# Patient Record
Sex: Female | Born: 1985 | Race: Black or African American | Hispanic: No | Marital: Single | Smoking: Current every day smoker
Health system: Southern US, Community
[De-identification: ages and names within clinical notes are randomized; demographics above are authoritative.]

## PROBLEM LIST (undated history)

## (undated) DIAGNOSIS — J40 Bronchitis, not specified as acute or chronic: Secondary | ICD-10-CM

---
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
## 2005-02-27 ENCOUNTER — Emergency Department (HOSPITAL_COMMUNITY): Admission: EM | Admit: 2005-02-27 | Discharge: 2005-02-28 | Payer: Self-pay | Admitting: Emergency Medicine

## 2005-04-15 ENCOUNTER — Emergency Department (HOSPITAL_COMMUNITY): Admission: EM | Admit: 2005-04-15 | Discharge: 2005-04-15 | Payer: Self-pay | Admitting: Emergency Medicine

## 2005-07-05 ENCOUNTER — Emergency Department (HOSPITAL_COMMUNITY): Admission: EM | Admit: 2005-07-05 | Discharge: 2005-07-05 | Payer: Self-pay | Admitting: Emergency Medicine

## 2005-08-13 ENCOUNTER — Emergency Department (HOSPITAL_COMMUNITY): Admission: EM | Admit: 2005-08-13 | Discharge: 2005-08-13 | Payer: Self-pay | Admitting: Emergency Medicine

## 2006-01-29 ENCOUNTER — Emergency Department (HOSPITAL_COMMUNITY): Admission: EM | Admit: 2006-01-29 | Discharge: 2006-01-29 | Payer: Self-pay | Admitting: Family Medicine

## 2006-09-20 ENCOUNTER — Inpatient Hospital Stay (HOSPITAL_COMMUNITY): Admission: AD | Admit: 2006-09-20 | Discharge: 2006-09-20 | Payer: Self-pay | Admitting: Obstetrics & Gynecology

## 2010-08-30 ENCOUNTER — Emergency Department (HOSPITAL_COMMUNITY)
Admission: EM | Admit: 2010-08-30 | Discharge: 2010-08-30 | Disposition: A | Discharge status: Home / Self Care | Payer: Self-pay | Attending: Emergency Medicine | Admitting: Emergency Medicine

## 2010-08-30 DIAGNOSIS — S025XXA Fracture of tooth (traumatic), initial encounter for closed fracture: Secondary | ICD-10-CM | POA: Insufficient documentation

## 2010-08-30 DIAGNOSIS — X58XXXA Exposure to other specified factors, initial encounter: Secondary | ICD-10-CM | POA: Insufficient documentation

## 2010-08-30 DIAGNOSIS — K089 Disorder of teeth and supporting structures, unspecified: Secondary | ICD-10-CM | POA: Insufficient documentation

## 2011-06-19 ENCOUNTER — Encounter (HOSPITAL_COMMUNITY): Payer: Self-pay

## 2011-06-19 ENCOUNTER — Emergency Department (HOSPITAL_COMMUNITY)
Admission: EM | Admit: 2011-06-19 | Discharge: 2011-06-19 | Disposition: A | Discharge status: Home / Self Care | Payer: Self-pay | Attending: Emergency Medicine | Admitting: Emergency Medicine

## 2011-06-19 DIAGNOSIS — L259 Unspecified contact dermatitis, unspecified cause: Secondary | ICD-10-CM

## 2011-06-19 DIAGNOSIS — M7989 Other specified soft tissue disorders: Secondary | ICD-10-CM | POA: Insufficient documentation

## 2011-06-19 MED ORDER — DIPHENHYDRAMINE HCL 25 MG PO CAPS
25.0000 mg | ORAL_CAPSULE | Freq: Once | ORAL | Status: AC
  Administered 2011-06-19: 25 mg via ORAL
  Filled 2011-06-19: qty 1

## 2011-06-19 NOTE — ED Provider Notes (Signed)
History     CSN: [id number]  Arrival date & time 06/19/11  1128   First MD Initiated Contact with Patient 06/19/11 1227      No chief complaint on file.   (Consider location/radiation/quality/duration/timing/severity/associated sxs/prior treatment) HPI Comments: Patient reports that yesterday evening she put a ring on that belonged to her grandmother.  She has never worn this ring before.  This morning when she woke up the finger was swollen and was burning.  She reports that she has never had an allergic reaction to jewelry before.  She is not having any other symptoms.  The history is provided by the patient.    Past Medical History  Diagnosis Date  . Asthma     No past surgical history on file.  No family history on file.  History  Substance Use Topics  . Smoking status: Current Some Day Smoker  . Smokeless tobacco: Not on file  . Alcohol Use: No    OB History    Grav Para Term Preterm Abortions TAB SAB Ect Mult Living                  Review of Systems  Constitutional: Negative for fever and chills.  Respiratory: Negative for shortness of breath.   Cardiovascular: Negative for chest pain.  Gastrointestinal: Negative for nausea and vomiting.  Skin: Negative for color change and rash.    Allergies  Review of patient's allergies indicates no known allergies.  Home Medications   Current Outpatient Rx  Name Route Sig Dispense Refill  . IRON PO Oral Take 1 tablet by mouth daily.    Marland Kitchen NAPROXEN SODIUM 220 MG PO TABS Oral Take 1,760 mg by mouth every 8 (eight) hours as needed. For pain.      BP 135/69  Pulse 89  Temp(Src) 98.3 F (36.8 C) (Oral)  Resp 20  SpO2 98%  Physical Exam  Nursing note and vitals reviewed. Constitutional: She is oriented to person, place, and time. She appears well-developed and well-nourished. No distress.  HENT:  Head: Normocephalic and atraumatic.  Cardiovascular: Normal rate, regular rhythm and normal heart sounds.     Pulmonary/Chest: Effort normal and breath sounds normal.  Musculoskeletal:       Swelling of the 4th digit of the right hand around the ring.  Normal capillary refill.  Sensation intact.    Neurological: She is alert and oriented to person, place, and time.  Skin: Skin is warm and dry. She is not diaphoretic.  Psychiatric: She has a normal mood and affect.    ED Course  Procedures (including critical care time)  Labs Reviewed - No data to display No results found.   1. Contact dermatitis    Ring on left 4th digit removed with a ring cutter while in the ED.   MDM  Feel that symptoms most likely due to an allergic reaction to something that was in the ring.  Ring removed while in the ED.  Patient discharged home and instructed to not wear that ring again.        Pascal Lux [facility] 06/19/11 1631

## 2011-06-19 NOTE — ED Notes (Signed)
Pt put on a new ring last night (ring from grandmother, material unknown), woke up this morning with swollen finger where the ring is. Tender upon touch, good capillary refill. Pain 10/10. Pt reports the ring fits fine when first put on.

## 2011-06-19 NOTE — ED Notes (Signed)
Denied any itchiness, reports the finger is burning. Ice applied.

## 2011-06-20 NOTE — ED Provider Notes (Signed)
Medical screening examination/treatment/procedure(s) were performed by non-physician practitioner and as supervising physician I was immediately available for consultation/collaboration.   Suzi Roots, MD 06/20/11 [phone]

## 2011-11-19 ENCOUNTER — Encounter (HOSPITAL_COMMUNITY): Payer: Self-pay

## 2011-11-19 ENCOUNTER — Emergency Department (HOSPITAL_COMMUNITY)
Admission: EM | Admit: 2011-11-19 | Discharge: 2011-11-19 | Disposition: A | Discharge status: Home / Self Care | Payer: Self-pay | Attending: Emergency Medicine | Admitting: Emergency Medicine

## 2011-11-19 DIAGNOSIS — F17200 Nicotine dependence, unspecified, uncomplicated: Secondary | ICD-10-CM | POA: Insufficient documentation

## 2011-11-19 DIAGNOSIS — K089 Disorder of teeth and supporting structures, unspecified: Secondary | ICD-10-CM | POA: Insufficient documentation

## 2011-11-19 DIAGNOSIS — S0993XA Unspecified injury of face, initial encounter: Secondary | ICD-10-CM

## 2011-11-19 MED ORDER — OXYCODONE-ACETAMINOPHEN 5-325 MG PO TABS
1.0000 | ORAL_TABLET | Freq: Once | ORAL | Status: AC
  Administered 2011-11-19: 1 via ORAL
  Filled 2011-11-19: qty 1

## 2011-11-19 MED ORDER — OXYCODONE-ACETAMINOPHEN 5-325 MG PO TABS: 1.0000 | ORAL_TABLET | Freq: Four times a day (QID) | ORAL | Status: AC | PRN

## 2011-11-19 MED ORDER — PENICILLIN V POTASSIUM 500 MG PO TABS: 500.0000 mg | ORAL_TABLET | Freq: Four times a day (QID) | ORAL | Status: AC

## 2011-11-19 NOTE — ED Provider Notes (Signed)
History     CSN: [id number]  Arrival date & time 11/19/11  1423   First MD Initiated Contact with Patient 11/19/11 1504      Chief Complaint  Patient presents with  . Dental Pain    (Consider location/radiation/quality/duration/timing/severity/associated sxs/prior treatment) HPI Comments: Patient presents to the emergency department with a dental complaint. Symptoms began last week. The patient has tried to alleviate pain with Vicodin, Advil & aleve.  Pain rated at a 10/10, characterized as throbbing in nature and located right bottom mouth. Patient denies fever, night sweats, chills, difficulty swallowing or opening mouth, SOB, nuchal rigidity or decreased ROM of neck.  Patient does not have a dentist and requests a resource guide at discharge.   Patient is a 26 y.o. female presenting with tooth pain. The history is provided by the patient.  Dental PainPrimary symptoms do not include headaches, fever, shortness of breath or sore throat.  Additional symptoms do not include: facial swelling, trouble swallowing, drooling and ear pain.    Past Medical History  Diagnosis Date  . Asthma     No past surgical history on file.  No family history on file.  History  Substance Use Topics  . Smoking status: Current Some Day Smoker -- 0.1 packs/day    Types: Cigarettes  . Smokeless tobacco: Not on file  . Alcohol Use: No    OB History    Grav Para Term Preterm Abortions TAB SAB Ect Mult Living                  Review of Systems  Constitutional: Negative for fever, chills, diaphoresis, activity change and appetite change.  HENT: Positive for dental problem. Negative for ear pain, congestion, sore throat, facial swelling, drooling, mouth sores, trouble swallowing, neck pain, neck stiffness, voice change, sinus pressure and tinnitus.   Eyes: Negative for pain and visual disturbance.  Respiratory: Negative for shortness of breath, wheezing and stridor.   Cardiovascular: Negative for  chest pain and leg swelling.  Gastrointestinal: Negative for nausea and abdominal pain.  Genitourinary: Negative for dysuria, urgency and frequency.  Musculoskeletal: Negative for myalgias.  Skin: Negative for rash.  Neurological: Negative for dizziness, syncope, speech difficulty, weakness, light-headedness, numbness and headaches.  Hematological: Negative for adenopathy.  Psychiatric/Behavioral: Negative for confusion.  All other systems reviewed and are negative.    Allergies  Review of patient's allergies indicates no known allergies.  Home Medications   Current Outpatient Rx  Name Route Sig Dispense Refill  . ACETAMINOPHEN 500 MG PO TABS Oral Take 1,500-2,000 mg by mouth every 6 (six) hours as needed. For pain.    Marland Kitchen VICODIN PO Oral Take 1 tablet by mouth once.    Drema Balzarine HCL 200-25 MG PO CAPS Oral Take 2 tablets by mouth every 8 (eight) hours as needed. For pain.      BP 131/71  Pulse 80  Temp 99.4 F (37.4 C) (Oral)  Resp 24  SpO2 98%  LMP 10/19/2011  Physical Exam  Nursing note and vitals reviewed. Constitutional: She is oriented to person, place, and time. She appears well-developed and well-nourished. No distress.  HENT:  Head: Normocephalic and atraumatic. No trismus in the jaw.  Mouth/Throat: Uvula is midline, oropharynx is clear and moist and mucous membranes are normal. Abnormal dentition. No dental abscesses or uvula swelling. No oropharyngeal exudate, posterior oropharyngeal edema, posterior oropharyngeal erythema or tonsillar abscesses.       Pt able to open and close mouth. Airway intact. Uvula  midline. Mild gingival swelling with tenderness over affected area, but no fluctuance. No swelling or tenderness of submental and submandibular regions. Bottom right middle molar with dental decay adn possible exposed dentin   Eyes: Conjunctivae and EOM are normal.  Neck: Normal range of motion and full passive range of motion without pain. Neck  supple.       Able to actively flex and extend neck, pain free ROM  Cardiovascular: Normal rate and regular rhythm.   Pulmonary/Chest: Effort normal and breath sounds normal. No stridor. No respiratory distress. She has no wheezes.       Airway intact, no drooling stridor or wheezing   Musculoskeletal: Normal range of motion.  Lymphadenopathy:       Head (right side): No submental, no submandibular, no tonsillar, no preauricular and no posterior auricular adenopathy present.       Head (left side): No submental, no submandibular, no tonsillar, no preauricular and no posterior auricular adenopathy present.    She has no cervical adenopathy.  Neurological: She is alert and oriented to person, place, and time.  Skin: Skin is warm and dry. No rash noted. She is not diaphoretic.    ED Course  Procedures (including critical care time)  Labs Reviewed - No data to display No results found.   No diagnosis found.    MDM  Dental pain  Patient with toothache.  No gross abscess.  Exam unconcerning for Ludwig's angina or spread of infection.  Will treat with penicillin and pain medicine.  Urged patient to follow-up with dentist.           Jaci Carrel, PA-C 11/19/11 1535

## 2011-11-19 NOTE — Discharge Instructions (Signed)
You have a dental injury. Use the resource guide listed below to help you find a dentist if you do not already have one to followup with. It is very important that you get evaluated by a dentist as soon as possible. Call tomorrow to schedule an appointment. Use your pain medication as prescribed and do not operate heavy machinery while on pain medication. Note that your pain medication contains acetaminophen (Tylenol) & its is not reccommended that you use additional acetaminophen (Tylenol) while taking this medication. Take your full course of antibiotics. Read the instructions below.  Eat a soft or liquid diet and rinse your mouth out after meals with warm water. You should see a dentist or return here at once if you have increased swelling, increased pain or uncontrolled bleeding from the site of your injury.   SEEK MEDICAL CARE IF:   You have increased pain not controlled with medicines.   You have swelling around your tooth, in your face or neck.   You have bleeding which starts, continues, or gets worse.   You have a fever >101  If you are unable to open your mouth  RESOURCE GUIDE  Dental Problems  Patients with Medicaid: [facility]                     [facility] [address]                                           [address] Phone:  [phone]                                                  Phone:  [phone]  If unable to pay or uninsured, contact:  [facility] or [facility] to become qualified for the adult dental clinic.  Chronic Pain Problems Contact [facility]  [phone] Patients need to be referred by their primary care doctor.  Insufficient Money for Medicine Contact [employer]:  call "211" or [facility] 271-5999.  No Primary Care Doctor Call [facility]  832-8000 Other agencies that provide inexpensive medical care    [facility]  [phone]    [facility]  [phone]    [facility]  [phone]    [facility] Clinic  [phone]    [facility]  [phone]    [facility]  [phone]  Psychological Services [facility]  [phone] [facility]  [phone] [facility]   [phone] (emergency services [phone])  Substance Abuse Resources [facility]  336-882-2125 [facility] 336-784-9470 [facility] 336-285-9073 [facility] 336-845-3988 Residential & Outpatient Substance Abuse Program  800-659-3381  Abuse/Neglect [place] Child Abuse Hotline (336) 641-3795 [place] Child Abuse Hotline 800-378-5315 (After Hours)  Emergency Shelter [facility] (336) 271-5985  [facility] (336) 275-9566 [facility] (704) 372-4663  MRSA Hotline #:   832-7006    [place] Resources  [facility] [place]     [employer]                            [facility] [address] [place]                       [address]      [address]  [place]                                                [place]                            [place] Phone:  [phone]                                   Phone:  [phone]                 Phone:  [phone]  [facility] Phone:  [phone]  [place] Child Abuse Hotline (336) 342-1394 (336) 342-3537 (After Hours)    

## 2011-11-19 NOTE — ED Provider Notes (Signed)
Medical screening examination/treatment/procedure(s) were performed by non-physician practitioner and as supervising physician I was immediately available for consultation/collaboration.   Dannia Snook A Kingjames Coury, MD 11/19/11 2359 

## 2011-11-19 NOTE — ED Notes (Signed)
Bottom RT tooth pain, broke off a few months ago.  Jaw is swollen and painful and states her dentist is not open today.  Also states she doesn't have the money to see her dentist anyway.  Has taken vicoden, advil pm, aleve and tylenol for pain and isn't able to get pain under control.

## 2012-06-19 ENCOUNTER — Emergency Department (HOSPITAL_COMMUNITY)
Admission: EM | Admit: 2012-06-19 | Discharge: 2012-06-19 | Disposition: A | Discharge status: Home / Self Care | Payer: Self-pay | Attending: Emergency Medicine | Admitting: Emergency Medicine

## 2012-06-19 ENCOUNTER — Encounter (HOSPITAL_COMMUNITY): Payer: Self-pay | Admitting: *Deleted

## 2012-06-19 ENCOUNTER — Emergency Department (HOSPITAL_COMMUNITY): Payer: Self-pay

## 2012-06-19 DIAGNOSIS — R52 Pain, unspecified: Secondary | ICD-10-CM | POA: Insufficient documentation

## 2012-06-19 DIAGNOSIS — J111 Influenza due to unidentified influenza virus with other respiratory manifestations: Secondary | ICD-10-CM

## 2012-06-19 DIAGNOSIS — J189 Pneumonia, unspecified organism: Secondary | ICD-10-CM | POA: Insufficient documentation

## 2012-06-19 DIAGNOSIS — J45909 Unspecified asthma, uncomplicated: Secondary | ICD-10-CM | POA: Insufficient documentation

## 2012-06-19 DIAGNOSIS — F17200 Nicotine dependence, unspecified, uncomplicated: Secondary | ICD-10-CM | POA: Insufficient documentation

## 2012-06-19 DIAGNOSIS — Z862 Personal history of diseases of the blood and blood-forming organs and certain disorders involving the immune mechanism: Secondary | ICD-10-CM | POA: Insufficient documentation

## 2012-06-19 LAB — CBC WITH DIFFERENTIAL/PLATELET
Basophils Relative: 0 % (ref 0–1)
HCT: 28.3 % — ABNORMAL LOW (ref 36.0–46.0)
Hemoglobin: 8.5 g/dL — ABNORMAL LOW (ref 12.0–15.0)
Lymphocytes Relative: 40 % (ref 12–46)
Monocytes Relative: 13 % — ABNORMAL HIGH (ref 3–12)
Neutro Abs: 1.7 10*3/uL (ref 1.7–7.7)
WBC: 3.6 10*3/uL — ABNORMAL LOW (ref 4.0–10.5)

## 2012-06-19 LAB — COMPREHENSIVE METABOLIC PANEL
ALT: 17 U/L (ref 0–35)
Alkaline Phosphatase: 92 U/L (ref 39–117)
CO2: 24 mEq/L (ref 19–32)
GFR calc Af Amer: >90 mL/min (ref >90)
Glucose, Bld: 95 mg/dL (ref 70–99)
Potassium: 3.7 mEq/L (ref 3.5–5.1)
Sodium: 135 mEq/L (ref 135–145)
Total Protein: 7.5 g/dL (ref 6.0–8.3)

## 2012-06-19 MED ORDER — ONDANSETRON HCL 4 MG/2ML IJ SOLN
4.0000 mg | Freq: Once | INTRAMUSCULAR | Status: AC
  Administered 2012-06-19: 4 mg via INTRAVENOUS
  Filled 2012-06-19: qty 2

## 2012-06-19 MED ORDER — KETOROLAC TROMETHAMINE 30 MG/ML IJ SOLN
30.0000 mg | Freq: Once | INTRAMUSCULAR | Status: AC
  Administered 2012-06-19: 30 mg via INTRAVENOUS
  Filled 2012-06-19: qty 1

## 2012-06-19 MED ORDER — METOCLOPRAMIDE HCL 10 MG PO TABS: 10.0000 mg | ORAL_TABLET | Freq: Four times a day (QID) | ORAL | Status: DC | PRN

## 2012-06-19 MED ORDER — ALBUTEROL SULFATE (5 MG/ML) 0.5% IN NEBU
5.0000 mg | INHALATION_SOLUTION | Freq: Once | RESPIRATORY_TRACT | Status: AC
  Administered 2012-06-19: 5 mg via RESPIRATORY_TRACT
  Filled 2012-06-19: qty 1

## 2012-06-19 MED ORDER — AMOXICILLIN-POT CLAVULANATE 875-125 MG PO TABS: 1.0000 | ORAL_TABLET | Freq: Two times a day (BID) | ORAL | Status: DC

## 2012-06-19 MED ORDER — LEVOFLOXACIN IN D5W 750 MG/150ML IV SOLN
750.0000 mg | Freq: Once | INTRAVENOUS | Status: AC
  Administered 2012-06-19: 750 mg via INTRAVENOUS
  Filled 2012-06-19: qty 150

## 2012-06-19 MED ORDER — ALBUTEROL SULFATE (5 MG/ML) 0.5% IN NEBU
INHALATION_SOLUTION | RESPIRATORY_TRACT | Status: AC
  Filled 2012-06-19: qty 1

## 2012-06-19 MED ORDER — ALBUTEROL SULFATE HFA 108 (90 BASE) MCG/ACT IN AERS: 1.0000 | INHALATION_SPRAY | Freq: Four times a day (QID) | RESPIRATORY_TRACT | Status: DC | PRN

## 2012-06-19 MED ORDER — SODIUM CHLORIDE 0.9 % IV BOLUS (SEPSIS)
1000.0000 mL | Freq: Once | INTRAVENOUS | Status: AC
  Administered 2012-06-19: 1000 mL via INTRAVENOUS

## 2012-06-19 NOTE — ED Notes (Signed)
Pt c/o nausea, vomiting, generalized body aches, and sore throat since last Thursday. Pt reports vomiting x's 5 in past 24 hours.

## 2012-06-19 NOTE — ED Provider Notes (Signed)
History     CSN: [id number]  Arrival date & time 06/19/12  1520   First MD Initiated Contact with Patient 06/19/12 1836      Chief Complaint  Patient presents with  . Emesis  . Generalized Body Aches  . Nausea    (Consider location/radiation/quality/duration/timing/severity/associated sxs/prior treatment) The history is provided by the patient.  Cheryl Crane is a 27 y.o. female history of asthma here with body aches, nausea, and SOB. Diffuse bodyaches for the last 5 days. She also has some subjective fevers at home and some sore throat. Today she felt nauseous and vomited several times. While in the bathroom today she feels short of breath but denies any wheezing. She didn't have a flu shot this year and she denies any sick contacts.   Past Medical History  Diagnosis Date  . Asthma     History reviewed. No pertinent past surgical history.  History reviewed. No pertinent family history.  History  Substance Use Topics  . Smoking status: Current Some Day Smoker -- 0.1 packs/day    Types: Cigarettes  . Smokeless tobacco: Not on file  . Alcohol Use: No    OB History    Grav Para Term Preterm Abortions TAB SAB Ect Mult Living                  Review of Systems  Constitutional: Positive for fever.  Respiratory: Positive for shortness of breath.   Gastrointestinal: Positive for vomiting.  All other systems reviewed and are negative.    Allergies  Review of patient's allergies indicates no known allergies.  Home Medications   Current Outpatient Rx  Name  Route  Sig  Dispense  Refill  . IBUPROFEN 200 MG PO TABS   Oral   Take 1,800 mg by mouth once.           BP 139/56  Pulse 94  Temp 99.1 F (37.3 C) (Oral)  Resp 20  Ht 5\' 1"  (1.549 m)  Wt 209 lb (94.802 kg)  BMI 39.49 kg/m2  SpO2 95%  LMP 06/19/2012  Physical Exam  Nursing note and vitals reviewed. Constitutional: She is oriented to person, place, and time. She appears well-developed.   Tired   HENT:  Head: Normocephalic.       MM slightly dry   Eyes: Conjunctivae normal are normal. Pupils are equal, round, and reactive to light.  Neck: Normal range of motion. Neck supple.  Cardiovascular: Normal rate, regular rhythm and normal heart sounds.   Pulmonary/Chest: Effort normal and breath sounds normal.       Diminished breath sounds throughout, no wheezing or crackles.   Abdominal: Soft. Bowel sounds are normal. She exhibits no distension. There is no tenderness. There is no rebound.  Musculoskeletal: Normal range of motion. She exhibits no edema.  Neurological: She is alert and oriented to person, place, and time.  Skin: Skin is warm and dry.  Psychiatric: She has a normal mood and affect. Her behavior is normal. Judgment and thought content normal.    ED Course  Procedures (including critical care time)  Labs Reviewed  CBC WITH DIFFERENTIAL - Abnormal; Notable for the following:    WBC 3.6 (*)     Hemoglobin 8.5 (*)     HCT 28.3 (*)     MCV 58.6 (*)     MCH 17.6 (*)     RDW 19.2 (*)     Monocytes Relative 13 (*)     All other components  within normal limits  COMPREHENSIVE METABOLIC PANEL   Dg Chest 2 View  06/19/2012  *RADIOLOGY REPORT*  Clinical Data: Chest pain, cough, emesis, body aches  CHEST - 2 VIEW  Comparison: 01/29/2006  Findings: Normal heart size, mediastinal contours, and pulmonary vascularity. Minimal patchy density in the left upper lobe question infiltrate. Minimal peribronchial thickening. Remaining lungs clear. No pleural effusion or pneumothorax. Bones unremarkable.  IMPRESSION: Minimal peribronchial thickening which could reflect bronchitis or reactive airway disease. Minimal patchy density left upper lobe suspicious for pneumonia. As the finding in left upper lobe is somewhat nodular in appearance, recommend follow-up chest radiograph 4 weeks to ensure resolution, in order to exclude pulmonary nodule.   Original Report Authenticated By: Ulyses Southward,  M.D.      No diagnosis found.    MDM  Cheryl Crane is a 28 y.o. female here with body aches, fevers. CXR showed L upper pneumonia. But I think she likely has flu. Will get labs, give IVF, levaquin and reassess. Patient doesn't appear septic and is not hypotensive.   9:09 PM Hg 8.5, but patient is currently on her period and she has that she has hx of anemia. Felt better after fluids and IV levaquin. Will d/c home on augmentin (patient doesn't have insurance). Return precaution given.       Richardean Canal, MD 06/19/12 2110

## 2012-06-19 NOTE — ED Notes (Signed)
Discharge instructions reviewed w/ pt., verbalizes understanding. Three prescriptions provided at discharge. 

## 2012-07-31 ENCOUNTER — Encounter (HOSPITAL_COMMUNITY): Payer: Self-pay

## 2012-07-31 ENCOUNTER — Inpatient Hospital Stay (HOSPITAL_COMMUNITY)
Admission: AD | Admit: 2012-07-31 | Discharge: 2012-07-31 | Disposition: A | Discharge status: Home / Self Care | Payer: Self-pay | Source: Ambulatory Visit | Attending: Obstetrics & Gynecology | Admitting: Obstetrics & Gynecology

## 2012-07-31 ENCOUNTER — Inpatient Hospital Stay (HOSPITAL_COMMUNITY): Payer: Self-pay

## 2012-07-31 DIAGNOSIS — R1032 Left lower quadrant pain: Secondary | ICD-10-CM | POA: Insufficient documentation

## 2012-07-31 DIAGNOSIS — R109 Unspecified abdominal pain: Secondary | ICD-10-CM

## 2012-07-31 DIAGNOSIS — O9989 Other specified diseases and conditions complicating pregnancy, childbirth and the puerperium: Secondary | ICD-10-CM

## 2012-07-31 DIAGNOSIS — D259 Leiomyoma of uterus, unspecified: Secondary | ICD-10-CM

## 2012-07-31 DIAGNOSIS — O99891 Other specified diseases and conditions complicating pregnancy: Secondary | ICD-10-CM | POA: Insufficient documentation

## 2012-07-31 DIAGNOSIS — O26899 Other specified pregnancy related conditions, unspecified trimester: Secondary | ICD-10-CM

## 2012-07-31 HISTORY — DX: Leiomyoma of uterus, unspecified: D25.9

## 2012-07-31 HISTORY — DX: Bronchitis, not specified as acute or chronic: J40

## 2012-07-31 LAB — URINALYSIS, ROUTINE W REFLEX MICROSCOPIC
Bilirubin Urine: NEGATIVE
Hgb urine dipstick: NEGATIVE
Ketones, ur: 15 mg/dL — AB
Nitrite: NEGATIVE
pH: 6 (ref 5.0–8.0)

## 2012-07-31 LAB — WET PREP, GENITAL
Clue Cells Wet Prep HPF POC: NONE SEEN
Trich, Wet Prep: NONE SEEN
WBC, Wet Prep HPF POC: NONE SEEN
Yeast Wet Prep HPF POC: NONE SEEN

## 2012-07-31 NOTE — MAU Note (Signed)
Patient states she had some abdominal pain on 2-22 that stopped. Started again this am at 0400.Denies bleeding, discharge, nausea, vomiting, diarrhea or pain with urination.

## 2012-07-31 NOTE — MAU Provider Note (Signed)
History     CSN: [id number]  Arrival date and time: 07/31/12 4098   First Provider Initiated Contact with Patient 07/31/12 1021      Chief Complaint  Patient presents with  . Abdominal Pain   HPI This is a 27 y.o. female at [redacted]w[redacted]d who presents with c/o intermittent Left lower quadrant pain. States it happened Saturday and again today. Had no idea she was pregnant. Was getting ready to move to [place] and does not want to be pregnant. Denies other symptoms, except a little constipation  RN Note: Patient states she had some abdominal pain on 2-22 that stopped. Started again this am at 0400.Denies bleeding, discharge, nausea, vomiting, diarrhea or pain with urination.   OB History   Grav Para Term Preterm Abortions TAB SAB Ect Mult Living   1               Past Medical History  Diagnosis Date  . Asthma   . Bronchitis     History reviewed. No pertinent past surgical history.  Family History  Problem Relation Age of Onset  . Diabetes Mother   . Heart disease Father     History  Substance Use Topics  . Smoking status: Current Some Day Smoker -- 0.15 packs/day    Types: Cigarettes  . Smokeless tobacco: Not on file  . Alcohol Use: No    Allergies: No Known Allergies  Prescriptions prior to admission  Medication Sig Dispense Refill  . albuterol (PROVENTIL HFA;VENTOLIN HFA) 108 (90 BASE) MCG/ACT inhaler Inhale 1-2 puffs into the lungs every 6 (six) hours as needed for wheezing.  1 Inhaler  0  . amoxicillin-clavulanate (AUGMENTIN) 875-125 MG per tablet Take 1 tablet by mouth 2 (two) times daily. One po bid x 7 days  14 tablet  0  . ibuprofen (ADVIL,MOTRIN) 200 MG tablet Take 1,800 mg by mouth once.      . metoCLOPramide (REGLAN) 10 MG tablet Take 1 tablet (10 mg total) by mouth every 6 (six) hours as needed (nausea/headache).  10 tablet  0    Review of Systems  Constitutional: Negative for fever, chills and malaise/fatigue.  Gastrointestinal: Positive for abdominal pain  and constipation. Negative for nausea, vomiting and diarrhea.  Genitourinary: Negative for dysuria.   Physical Exam   Blood pressure 137/82, pulse 78, temperature 98.4 F (36.9 C), temperature source Oral, resp. rate 16, height 5' 2.5" (1.588 m), weight 217 lb 9.6 oz (98.703 kg), last menstrual period 06/17/2012, SpO2 100.00%.  Physical Exam  Constitutional: She appears well-developed and well-nourished. No distress.  HENT:  Head: Normocephalic.  Cardiovascular: Normal rate.   Respiratory: Effort normal.  GI: Soft.   Results for orders placed during the hospital encounter of 07/31/12 (from the past 24 hour(s))  POCT PREGNANCY, URINE     Status: Abnormal   Collection Time    07/31/12 10:25 AM      Result Value Range   Preg Test, Ur POSITIVE (*) NEGATIVE    MAU Course  Procedures  MDM Will check quant and [place].  Assessment and Plan    [facility] 07/31/2012, 10:43 AM  Care assumed at 1300:  Results for orders placed during the hospital encounter of 07/31/12 (from the past 24 hour(s))  URINALYSIS, ROUTINE W REFLEX MICROSCOPIC     Status: Abnormal   Collection Time    07/31/12 10:18 AM      Result Value Range   Color, Urine YELLOW  YELLOW   APPearance CLEAR  CLEAR  Specific Gravity, Urine 1.025  1.005 - 1.030   pH 6.0  5.0 - 8.0   Glucose, UA NEGATIVE  NEGATIVE mg/dL   Hgb urine dipstick NEGATIVE  NEGATIVE   Bilirubin Urine NEGATIVE  NEGATIVE   Ketones, ur 15 (*) NEGATIVE mg/dL   Protein, ur NEGATIVE  NEGATIVE mg/dL   Urobilinogen, UA 1.0  0.0 - 1.0 mg/dL   Nitrite NEGATIVE  NEGATIVE   Leukocytes, UA NEGATIVE  NEGATIVE  POCT PREGNANCY, URINE     Status: Abnormal   Collection Time    07/31/12 10:25 AM      Result Value Range   Preg Test, Ur POSITIVE (*) NEGATIVE  HCG, QUANTITATIVE, PREGNANCY     Status: Abnormal   Collection Time    07/31/12 10:49 AM      Result Value Range   hCG, Beta Chain, Quant, S 821 (*) <5 mIU/mL  WET PREP, GENITAL     Status: None    Collection Time    07/31/12 10:55 AM      Result Value Range   Yeast Wet Prep HPF POC NONE SEEN  NONE SEEN   Trich, Wet Prep NONE SEEN  NONE SEEN   Clue Cells Wet Prep HPF POC NONE SEEN  NONE SEEN   WBC, Wet Prep HPF POC NONE SEEN  NONE SEEN  No results found.

## 2012-08-01 ENCOUNTER — Encounter: Payer: Self-pay | Admitting: Advanced Practice Midwife

## 2012-08-01 LAB — GC/CHLAMYDIA PROBE AMP: GC Probe RNA: NEGATIVE

## 2012-08-02 ENCOUNTER — Inpatient Hospital Stay (HOSPITAL_COMMUNITY)
Admission: AD | Admit: 2012-08-02 | Discharge: 2012-08-02 | Disposition: A | Discharge status: Home / Self Care | Payer: Self-pay | Source: Ambulatory Visit | Attending: Obstetrics & Gynecology | Admitting: Obstetrics & Gynecology

## 2012-08-02 DIAGNOSIS — Z3201 Encounter for pregnancy test, result positive: Secondary | ICD-10-CM

## 2012-08-02 DIAGNOSIS — R109 Unspecified abdominal pain: Secondary | ICD-10-CM | POA: Insufficient documentation

## 2012-08-02 DIAGNOSIS — O99891 Other specified diseases and conditions complicating pregnancy: Secondary | ICD-10-CM | POA: Insufficient documentation

## 2012-08-02 NOTE — MAU Provider Note (Signed)
Ms. Cheryl Crane is a 27 y.o. G1P0 at [redacted]w[redacted]d who presents to MAU for follow-up 48 hour hCG. The patient denies pain or vaginal bleeding today. Quant hCG at last visit was 821. [place] did not show anything in the uterus or adnexa.   Results for orders placed during the hospital encounter of 08/02/12 (from the past 24 hour(s))  HCG, QUANTITATIVE, PREGNANCY     Status: Abnormal   Collection Time    08/02/12 10:34 AM      Result Value Range   hCG, Beta Chain, Quant, S 1118 (*) <5 mIU/mL   MDM: Discussed lab results with Dr. Macon Large. She recommends follow-up quant hCG in another 48 hours, then possible [place] then if still abnormal rise.   A: Abnormal rise in quant hCG  P: Discharge patient Return in 48 hours for follow-up quant hCG Bleeding/Ectopic precautions discussed Patient may return to MAU sooner if symptoms change or worsen  Freddi Starr, PA-C  08/02/2012 11:35 AM

## 2012-08-02 NOTE — MAU Note (Signed)
Patient to MAU for repeat BHCG. Patient denies any pain or bleeding.  

## 2012-08-03 NOTE — MAU Provider Note (Signed)
Attestation of Attending Supervision of Advanced Practitioner (PA/CNM/NP): Evaluation and management procedures were performed by the Advanced Practitioner under my supervision and collaboration.  I have reviewed the Advanced Practitioner's note and chart, and I agree with the management and plan.  Mandeep Ferch, MD, FACOG Attending Obstetrician & Gynecologist Faculty Practice, [facility]  

## 2012-08-04 ENCOUNTER — Inpatient Hospital Stay (HOSPITAL_COMMUNITY): Payer: Self-pay

## 2012-08-04 ENCOUNTER — Encounter (HOSPITAL_COMMUNITY): Payer: Self-pay | Admitting: Advanced Practice Midwife

## 2012-08-04 ENCOUNTER — Inpatient Hospital Stay (HOSPITAL_COMMUNITY)
Admission: AD | Admit: 2012-08-04 | Discharge: 2012-08-04 | Disposition: A | Discharge status: Home / Self Care | Payer: Self-pay | Source: Ambulatory Visit | Attending: Obstetrics and Gynecology | Admitting: Obstetrics and Gynecology

## 2012-08-04 DIAGNOSIS — R1032 Left lower quadrant pain: Secondary | ICD-10-CM | POA: Insufficient documentation

## 2012-08-04 DIAGNOSIS — Z09 Encounter for follow-up examination after completed treatment for conditions other than malignant neoplasm: Secondary | ICD-10-CM | POA: Insufficient documentation

## 2012-08-04 DIAGNOSIS — O99891 Other specified diseases and conditions complicating pregnancy: Secondary | ICD-10-CM | POA: Insufficient documentation

## 2012-08-04 NOTE — MAU Provider Note (Signed)
Cheryl Crane is a G1P0 at [redacted] weeks EGA here for repeat quant HCG. Seen originally on 2/24 with LLQ pain, quant 821 at that time with no IUP or adnexal mass on u/s. Repeat quant on 2/26 did not rise as expected, 1116 that day. Here for second repeat quant 48 hours later. Denies pain or bleeding today.   BP 120/61  Pulse 88  Temp(Src) 98.1 F (36.7 C) (Oral)  Resp 18  LMP 06/17/2012  Gen: well, no distress  Results for orders placed during the hospital encounter of 08/04/12 (from the past 72 hour(s))  HCG, QUANTITATIVE, PREGNANCY     Status: Abnormal   Collection Time    08/04/12  2:45 PM      Result Value Range   hCG, Beta Chain, Quant, S 1613 (*) <5 mIU/mL   Comment:              GEST. AGE      CONC.  (mIU/mL)       <=1 WEEK        5 - 50         2 WEEKS       50 - 500         3 WEEKS       100 - 10,000         4 WEEKS     1,000 - 30,000         5 WEEKS     3,500 - 115,000       6-8 WEEKS     12,000 - 270,000        12 WEEKS     15,000 - 220,000                FEMALE AND NON-PREGNANT FEMALE:         LESS THAN 5 mIU/mL   US Ob Comp Less 14 Wks  08/04/2012  *RADIOLOGY REPORT*  Clinical Data: abnormally rising quant,abnormal quants; ;  OBSTETRIC <14 WK [place] AND TRANSVAGINAL OB US  Technique: Both transabdominal and transvaginal ultrasound examinations were performed for complete evaluation of the gestation as well as the maternal uterus, adnexal regions, and pelvic cul-de-sac.  Comparison: 07/31/2012  Findings: There is a single early intrauterine pregnancy.  Crown- rump length is 3.8 mm for an estimated gestational age of [redacted] weeks 1 day.  Fetal heart rate 102 beats per minute.  There is a large subchorionic hemorrhage.  Multiple large fibroids.  8.2 cm right fibroid.  6.7 cm right posterior fibroid.  7.0 cm fundal fibroid.  Right ovary is unremarkable.  Left ovary not visualized.  No adnexal masses.  No free fluid.  IMPRESSION: 6-week-1-day intrauterine pregnancy with a fetal heart rate of  102 beats per minute.  There is a large subchorionic hemorrhage. Multiple large uterine fibroids.   Original Report Authenticated By: Charlett Nose, M.D.    US Ob Comp Less 14 Wks  08/01/2012  OBSTETRICAL ULTRASOUND: This exam was performed within a [facility]. The OB US report was generated in the AS system, and faxed to the ordering physician.   This report is also available in [employer] and in the [employer]. See AS Obstetric US report.   US Ob Transvaginal  08/04/2012  *RADIOLOGY REPORT*  Clinical Data: abnormally rising quant,abnormal quants; ;  OBSTETRIC <14 WK [place] AND TRANSVAGINAL OB US  Technique: Both transabdominal and transvaginal ultrasound examinations were performed for complete evaluation of the gestation as well  as the maternal uterus, adnexal regions, and pelvic cul-de-sac.  Comparison: 07/31/2012  Findings: There is a single early intrauterine pregnancy.  Crown- rump length is 3.8 mm for an estimated gestational age of [redacted] weeks 1 day.  Fetal heart rate 102 beats per minute.  There is a large subchorionic hemorrhage.  Multiple large fibroids.  8.2 cm right fibroid.  6.7 cm right posterior fibroid.  7.0 cm fundal fibroid.  Right ovary is unremarkable.  Left ovary not visualized.  No adnexal masses.  No free fluid.  IMPRESSION: 6-week-1-day intrauterine pregnancy with a fetal heart rate of 102 beats per minute.  There is a large subchorionic hemorrhage. Multiple large uterine fibroids.   Original Report Authenticated By: Charlett Nose, M.D.     A/P: 27 y.o. G1P0 at 6 weeks by u/s today - abnormally rising quant, but u/s shows an early IUP with + cardiac activity today. Rev'd with patient that the prognosis is guarded considering quants, large subchorionic hemorrhage. Large fibroids also noted on u/s today.  Start prenatal care as soon as possible, return with heavy bleeding or pain

## 2012-08-04 NOTE — MAU Note (Signed)
Here for rpt BHCG.  Discussed changes in BHCG, is now double what first was, in 4 days. Not what we expect. So the CNM would like to do an [place] to check things out.

## 2012-08-09 ENCOUNTER — Inpatient Hospital Stay (HOSPITAL_COMMUNITY)
Admission: AD | Admit: 2012-08-09 | Discharge: 2012-08-09 | Disposition: A | Discharge status: Home / Self Care | Payer: Medicaid Other | Source: Ambulatory Visit | Attending: Obstetrics & Gynecology | Admitting: Obstetrics & Gynecology

## 2012-08-09 ENCOUNTER — Inpatient Hospital Stay (HOSPITAL_COMMUNITY): Payer: Medicaid Other

## 2012-08-09 ENCOUNTER — Encounter (HOSPITAL_COMMUNITY): Payer: Self-pay | Admitting: *Deleted

## 2012-08-09 DIAGNOSIS — O26851 Spotting complicating pregnancy, first trimester: Secondary | ICD-10-CM

## 2012-08-09 DIAGNOSIS — O26859 Spotting complicating pregnancy, unspecified trimester: Secondary | ICD-10-CM | POA: Insufficient documentation

## 2012-08-09 LAB — CBC
HCT: 28.7 % — ABNORMAL LOW (ref 36.0–46.0)
Hemoglobin: 8.4 g/dL — ABNORMAL LOW (ref 12.0–15.0)
MCHC: 29.3 g/dL — ABNORMAL LOW (ref 30.0–36.0)
RBC: 4.85 MIL/uL (ref 3.87–5.11)

## 2012-08-09 LAB — ABO/RH: ABO/RH(D): A POS

## 2012-08-09 NOTE — MAU Provider Note (Signed)
Attestation of Attending Supervision of Advanced Practitioner (CNM/NP): Evaluation and management procedures were performed by the Advanced Practitioner under my supervision and collaboration.  I have reviewed the Advanced Practitioner's note and chart, and I agree with the management and plan.  Jashiya Bassett 08/09/2012 9:10 AM   

## 2012-08-09 NOTE — MAU Provider Note (Signed)
Chief Complaint: Vaginal Bleeding   First Halvor Behrend Initiated Contact with Patient 08/09/12 [phone]     SUBJECTIVE HPI: Cheryl Crane is a 27 y.o. G1P0 at [redacted]w[redacted]d by LMP who presents with small amount of VB that had continued since previous visit to MAU. [place] 08/04/12 show pos FP, FHR 102, large SCH. Denies abd pain, passage of tissue.    Past Medical History  Diagnosis Date  . Asthma   . Bronchitis    OB History   Grav Para Term Preterm Abortions TAB SAB Ect Mult Living   1              # Outc Date GA Lbr Len/2nd Wgt Sex Del Anes PTL Lv   1 CUR              History reviewed. No pertinent past surgical history. History   Social History  . Marital Status: Single    Spouse Name: N/A    Number of Children: N/A  . Years of Education: N/A   Occupational History  . Not on file.   Social History Main Topics  . Smoking status: Current Some Day Smoker -- 0.15 packs/day    Types: Cigarettes  . Smokeless tobacco: Not on file  . Alcohol Use: No  . Drug Use: No  . Sexually Active: Yes   Other Topics Concern  . Not on file   Social History Narrative  . No narrative on file   No current facility-administered medications on file prior to encounter.   Current Outpatient Prescriptions on File Prior to Encounter  Medication Sig Dispense Refill  . acetaminophen (TYLENOL) 500 MG tablet Take 1,000 mg by mouth daily as needed for pain.      Marland Kitchen albuterol (PROVENTIL HFA;VENTOLIN HFA) 108 (90 BASE) MCG/ACT inhaler Inhale 1-2 puffs into the lungs every 6 (six) hours as needed for wheezing.  1 Inhaler  0   No Known Allergies  ROS: Pertinent items in HPI  OBJECTIVE Blood pressure 136/74, pulse 87, temperature 98.2 F (36.8 C), temperature source Oral, resp. rate 20, height 5' (1.524 m), weight 97.977 kg (216 lb), last menstrual period 06/17/2012, SpO2 99.00%. GENERAL: Well-developed, well-nourished female in no acute distress.  HEENT: Normocephalic HEART: normal rate RESP: normal  effort ABDOMEN: Soft, non-tender NEURO: Alert and oriented SPECULUM EXAM: Deferred. Scant blood on pad.   LAB RESULTS Results for orders placed during the hospital encounter of 08/09/12 (from the past 24 hour(s))  ABO/RH     Status: None   Collection Time    08/09/12  6:31 AM      Result Value Range   ABO/RH(D) A POS    CBC     Status: Abnormal   Collection Time    08/09/12  6:32 AM      Result Value Range   WBC 5.8  4.0 - 10.5 K/uL   RBC 4.85  3.87 - 5.11 MIL/uL   Hemoglobin 8.4 (*) 12.0 - 15.0 g/dL   HCT 78.2 (*) 95.6 - 21.3 %   MCV 59.2 (*) 78.0 - 100.0 fL   MCH 17.3 (*) 26.0 - 34.0 pg   MCHC 29.3 (*) 30.0 - 36.0 g/dL   RDW 08.6 (*) 57.8 - 46.9 %   Platelets 470 (*) 150 - 400 K/uL    IMAGING US Ob Comp Less 14 Wks  08/04/2012  *RADIOLOGY REPORT*  Clinical Data: abnormally rising quant,abnormal quants; ;  OBSTETRIC <14 WK [place] AND TRANSVAGINAL OB US  Technique: Both transabdominal  and transvaginal ultrasound examinations were performed for complete evaluation of the gestation as well as the maternal uterus, adnexal regions, and pelvic cul-de-sac.  Comparison: 07/31/2012  Findings: There is a single early intrauterine pregnancy.  Crown- rump length is 3.8 mm for an estimated gestational age of [redacted] weeks 1 day.  Fetal heart rate 102 beats per minute.  There is a large subchorionic hemorrhage.  Multiple large fibroids.  8.2 cm right fibroid.  6.7 cm right posterior fibroid.  7.0 cm fundal fibroid.  Right ovary is unremarkable.  Left ovary not visualized.  No adnexal masses.  No free fluid.  IMPRESSION: 6-week-1-day intrauterine pregnancy with a fetal heart rate of 102 beats per minute.  There is a large subchorionic hemorrhage. Multiple large uterine fibroids.   Original Report Authenticated By: Charlett Nose, M.D.    US Ob Comp Less 14 Wks  08/01/2012  OBSTETRICAL ULTRASOUND: This exam was performed within a [facility]. The OB US report was generated in the AS system,  and faxed to the ordering physician.   This report is also available in [employer] and in the [employer]. See AS Obstetric US report.   US Ob Transvaginal  08/04/2012  *RADIOLOGY REPORT*  Clinical Data: abnormally rising quant,abnormal quants; ;  OBSTETRIC <14 WK [place] AND TRANSVAGINAL OB US  Technique: Both transabdominal and transvaginal ultrasound examinations were performed for complete evaluation of the gestation as well as the maternal uterus, adnexal regions, and pelvic cul-de-sac.  Comparison: 07/31/2012  Findings: There is a single early intrauterine pregnancy.  Crown- rump length is 3.8 mm for an estimated gestational age of [redacted] weeks 1 day.  Fetal heart rate 102 beats per minute.  There is a large subchorionic hemorrhage.  Multiple large fibroids.  8.2 cm right fibroid.  6.7 cm right posterior fibroid.  7.0 cm fundal fibroid.  Right ovary is unremarkable.  Left ovary not visualized.  No adnexal masses.  No free fluid.  IMPRESSION: 6-week-1-day intrauterine pregnancy with a fetal heart rate of 102 beats per minute.  There is a large subchorionic hemorrhage. Multiple large uterine fibroids.   Original Report Authenticated By: Charlett Nose, M.D.     MAU COURSE  ASSESSMENT 1. Spotting complicating pregnancy in first trimester     PLAN Discharge home Reassurance given. Pelvic rest     Follow-up Information   Follow up with [facility].      Follow up with [facility] [place] [facility]. (As needed if symptoms worsen)    Contact information:   [address] [place] [address] [phone]       Medication List    TAKE these medications       acetaminophen 500 MG tablet  Commonly known as:  TYLENOL  Take 1,000 mg by mouth daily as needed for pain.     albuterol 108 (90 BASE) MCG/ACT inhaler  Commonly known as:  PROVENTIL HFA;VENTOLIN HFA  Inhale 1-2 puffs into the lungs every 6 (six) hours as needed for wheezing.      prenatal multivitamin Tabs  Take 1 tablet by mouth daily at 12 noon.       [place], CNM 08/09/2012  8:20 AM

## 2012-08-09 NOTE — MAU Note (Signed)
Pt presents with complaint of continued bleeding since last visit here. States it not a lot but she is worried.

## 2012-08-16 ENCOUNTER — Encounter (HOSPITAL_COMMUNITY): Payer: Self-pay

## 2012-08-16 ENCOUNTER — Inpatient Hospital Stay (HOSPITAL_COMMUNITY)
Admission: AD | Admit: 2012-08-16 | Discharge: 2012-08-16 | Disposition: A | Discharge status: Home / Self Care | Payer: Medicaid Other | Source: Ambulatory Visit | Attending: Obstetrics and Gynecology | Admitting: Obstetrics and Gynecology

## 2012-08-16 ENCOUNTER — Inpatient Hospital Stay (HOSPITAL_COMMUNITY): Payer: Medicaid Other

## 2012-08-16 DIAGNOSIS — O200 Threatened abortion: Secondary | ICD-10-CM

## 2012-08-16 DIAGNOSIS — R109 Unspecified abdominal pain: Secondary | ICD-10-CM | POA: Insufficient documentation

## 2012-08-16 DIAGNOSIS — O368390 Maternal care for abnormalities of the fetal heart rate or rhythm, unspecified trimester, not applicable or unspecified: Secondary | ICD-10-CM | POA: Insufficient documentation

## 2012-08-16 LAB — URINALYSIS, ROUTINE W REFLEX MICROSCOPIC
Glucose, UA: NEGATIVE mg/dL
Protein, ur: NEGATIVE mg/dL
pH: 6 (ref 5.0–8.0)

## 2012-08-16 LAB — URINE MICROSCOPIC-ADD ON

## 2012-08-16 NOTE — MAU Note (Signed)
Patient states she has had bleeding since last Wednesday but heavier than it has been. States she started having abdominal cramping last night and continues this am.

## 2012-08-16 NOTE — MAU Note (Signed)
Patient is in with c/o increased vaginal bleeding and lower abdominal cramping since yesterday.

## 2012-08-16 NOTE — MAU Provider Note (Signed)
  History     CSN: [id number]  Arrival date and time: 08/16/12 4098   First Provider Initiated Contact with Patient 08/16/12 1011      Chief Complaint  Patient presents with  . Abdominal Pain  . Vaginal Bleeding   HPI Cheryl Crane 27 y.o. [redacted]w[redacted]d   Comes in today with heavier vaginal bleeding and lower abdominal cramping.  Was seen last in MAU on 08-09-12 and had a large subchorionic hemorrhage.  Has had continuous bleeding since previous visit and lower abd cramping since yesterday.  Has passed clots with the bleeding. Is worried about this pregnancy.    OB History   Grav Para Term Preterm Abortions TAB SAB Ect Mult Living   1               Past Medical History  Diagnosis Date  . Asthma   . Bronchitis     History reviewed. No pertinent past surgical history.  Family History  Problem Relation Age of Onset  . Diabetes Mother   . Heart disease Father     History  Substance Use Topics  . Smoking status: Current Some Day Smoker -- 0.15 packs/day    Types: Cigarettes  . Smokeless tobacco: Not on file  . Alcohol Use: No    Allergies: No Known Allergies  Prescriptions prior to admission  Medication Sig Dispense Refill  . acetaminophen (TYLENOL) 500 MG tablet Take 1,000 mg by mouth daily as needed for pain.      . Prenatal Vit-Fe Fumarate-FA (PRENATAL MULTIVITAMIN) TABS Take 1 tablet by mouth daily at 12 noon.      Marland Kitchen albuterol (PROVENTIL HFA;VENTOLIN HFA) 108 (90 BASE) MCG/ACT inhaler Inhale 1-2 puffs into the lungs every 6 (six) hours as needed for wheezing.  1 Inhaler  0    Review of Systems  Constitutional: Negative for fever and chills.  Respiratory: Negative for shortness of breath.   Cardiovascular: Negative for chest pain.  Gastrointestinal: Positive for abdominal pain and constipation. Negative for diarrhea.  Genitourinary: Positive for dysuria.       Vaginal bleeding  Musculoskeletal: Positive for back pain.  Neurological: Negative for dizziness and  headaches.   Physical Exam   Height 5\' 3"  (1.6 m), weight 96.888 kg (213 lb 9.6 oz), last menstrual period 06/17/2012.  Physical Exam  Nursing note and vitals reviewed. Constitutional: She is oriented to person, place, and time. She appears well-developed and well-nourished. No distress.  HENT:  Head: Normocephalic.  Eyes: EOM are normal.  Neck: Neck supple.  Cardiovascular: Normal rate and regular rhythm.   Respiratory: Breath sounds normal.  GI: Soft. Bowel sounds are normal. There is tenderness. There is no rebound and no guarding.  Musculoskeletal: Normal range of motion.  Neurological: She is alert and oriented to person, place, and time.  Skin: Skin is warm and dry.  Psychiatric: She has a normal mood and affect.    MAU Course  Procedures  MDM   Assessment and Plan  Threatened Miscarriage - bradycardia in fetus with large subchorionic hemorrhage (previously had resolved) Bleeding in pregnancy  Plan  Has an appointment in prenatal clinic next month for prenatal care. Message sent to clinic that she is likely having a miscarriage and will need to be seen in 2 weeks. Miscarriage is very possible.  Javier Mamone 08/16/2012, 11:35 AM

## 2012-08-17 NOTE — MAU Provider Note (Signed)
Attestation of Attending Supervision of Advanced Practitioner (CNM/NP): Evaluation and management procedures were performed by the Advanced Practitioner under my supervision and collaboration.  I have reviewed the Advanced Practitioner's note and chart, and I agree with the management and plan.  Cheryl Crane,Cheryl Crane 08/17/2012 11:41 PM

## 2012-09-13 ENCOUNTER — Encounter: Payer: Self-pay | Admitting: Family Medicine

## 2013-06-05 ENCOUNTER — Encounter (HOSPITAL_COMMUNITY): Payer: Self-pay | Admitting: *Deleted

## 2013-08-06 ENCOUNTER — Emergency Department (HOSPITAL_COMMUNITY)
Admission: EM | Admit: 2013-08-06 | Discharge: 2013-08-06 | Disposition: A | Discharge status: Home / Self Care | Payer: Medicaid Other | Attending: Emergency Medicine | Admitting: Emergency Medicine

## 2013-08-06 ENCOUNTER — Encounter (HOSPITAL_COMMUNITY): Payer: Self-pay | Admitting: Emergency Medicine

## 2013-08-06 ENCOUNTER — Emergency Department (HOSPITAL_COMMUNITY): Payer: Medicaid Other

## 2013-08-06 DIAGNOSIS — F17200 Nicotine dependence, unspecified, uncomplicated: Secondary | ICD-10-CM | POA: Insufficient documentation

## 2013-08-06 DIAGNOSIS — Z79899 Other long term (current) drug therapy: Secondary | ICD-10-CM | POA: Insufficient documentation

## 2013-08-06 DIAGNOSIS — M25569 Pain in unspecified knee: Secondary | ICD-10-CM | POA: Insufficient documentation

## 2013-08-06 DIAGNOSIS — J45909 Unspecified asthma, uncomplicated: Secondary | ICD-10-CM | POA: Insufficient documentation

## 2013-08-06 MED ORDER — TRAMADOL HCL 50 MG PO TABS: 50.0000 mg | ORAL_TABLET | Freq: Four times a day (QID) | ORAL | Status: DC | PRN

## 2013-08-06 MED ORDER — TRAMADOL HCL 50 MG PO TABS
50.0000 mg | ORAL_TABLET | Freq: Once | ORAL | Status: AC
Start: 2013-08-06 — End: 2013-08-06
  Administered 2013-08-06: 50 mg via ORAL
  Filled 2013-08-06: qty 1

## 2013-08-06 NOTE — Discharge Instructions (Signed)
Knee Bracing Knee braces are supports to help stabilize and protect an injured or painful knee. They come in many different styles. They should support and protect the knee without increasing the chance of other injuries to yourself or others. It is important not to have a false sense of security when using a brace. Knee braces that help you to keep using your knee:  Do not restore normal knee stability under high stress forces.  May decrease some aspects of athletic performance. Some of the different types of knee braces are:  Prophylactic knee braces are designed to prevent or reduce the severity of knee injuries during sports that make injury to the knee more likely.  Rehabilitative knee braces are designed to allow protected motion of:  Injured knees.  Knees that have been treated with or without surgery. There is no evidence that the use of a supportive knee brace protects the graft following a successful anterior cruciate ligament (ACL) reconstruction. However, braces are sometimes used to:   Protect injured ligaments.  Control knee movement during the initial healing period. They may be used as part of the treatment program for the various injured ligaments or cartilage of the knee including the:  Anterior cruciate ligament.  Medial collateral ligament.  Medial or lateral cartilage (meniscus).  Posterior cruciate ligament.  Lateral collateral ligament. Rehabilitative knee braces are most commonly used:  During crutch-assisted walking right after injury.  During crutch-assisted walking right after surgery to repair the cartilage and/or cruciate ligament injury.  For a short period of time, 2-8 weeks, after the injury or surgery. The value of a rehabilitative brace as opposed to a cast or splint includes the:  Ability to adjust the brace for swelling.  Ability to remove the brace for examinations, icing or showering.  Ability to allow for movement in a controlled  range of motion. Functional knee braces give support to knees that have already been injured. They are designed to provide stability for the injured knee and provide protection after repair. Functional knee braces may not affect performance much. Lower extremity muscle strengthening, flexibility, and improvement in technique are more important than bracing in treating ligamentous knee injuries. Functional braces are not a substitute for rehabilitation or surgical procedures. Unloader/offloader braces are designed to provide pain relief in arthritic knees. Patients with wear and tear arthritis from growing old or from an old cartilage injury (osteoarthritis) of the knee, and bow legged (varus) or knock knee (valgus) deformities, often develop increased pain in the arthritic side due to increased loading. Unloader/offloader braces are made to reduce uneven loading in such knees. There is reduction in bowing out movement in bow legged knees when the correct unloader brace is used. Patients with advanced osteoarthritis or severe varus or valgus alignment problems would not likely benefit from bracing. Patellofemoral braces help the kneecap to move smoothly and well centered over the end of the femur in the knee.  Most people who wear knee braces feel that they help. However, there is a lack of scientific evidence that knee braces are helpful at the level needed for athletic participation to prevent injury. In spite of this, athletes report an increase in knee stability, pain relief, performance improvement, and confidence during athletics when using a brace.  Different knee problems require different knee braces:  Your caregiver may suggest one kind of knee brace after knee surgery.  A caregiver may choose another kind of knee brace for support instead of surgery for some types of torn ligaments.  You may also need one for pain in the front of your knee that is not getting better with strengthening and  flexibility exercises. Get your caregiver's advice if you want to try a knee brace. The caregiver will advise you on where to get them and provide a prescription when it is needed to fashion and/or fit the brace. Knee braces are the least important part of preventing knee injuries or getting better following injury. Stretching, strengthening and technique improvement are far more important in caring for and preventing knee injuries. When strengthening your knee, increase your activities a little at a time so as not to develop injuries from over use. Work out an exercise plan with your caregiver and/or physical therapist to get the best program for you. Do not let a knee brace become a crutch. Always remember, there are no braces which support the knee as well as your original ligaments and cartilage you were born with. Conditioning, proper warm-up and stretching remain the most important parts of keeping your knees healthy. HOW TO USE A KNEE BRACE  During sports, knee braces should be used as directed by your caregiver.  Make sure that the hinges are where the knee bends.  Straps, tapes, or hook-and-loop tapes should be fastened around your leg as instructed.  You should check the placement of the brace during activities to make sure that it has not moved. Poorly positioned braces can hurt rather than help you.  To work well, a knee brace should be worn during all activities that put you at risk of knee injury.  Warm up properly before beginning athletic activities. HOME CARE INSTRUCTIONS  Knee braces often get damaged during normal use. Replace worn-out braces for maximum benefit.  Clean regularly with soap and water.  Inspect your brace often for wear and tear.  Cover exposed metal to protect others from injury.  Durable materials may cost more, but last longer. SEEK IMMEDIATE MEDICAL CARE IF:   Your knee seems to be getting worse rather than better.  You have increasing pain or  swelling in the knee.  You have problems caused by the knee brace.  You have increased swelling or inflammation (redness or soreness) in your knee.  Your knee becomes warm and more painful and you develop an unexplained temperature over 101 F (38.3 C). MAKE SURE YOU:   Understand these instructions.  Will watch your condition.  Will get help right away if you are not doing well or get worse. See your caregiver, physical therapist or orthopedic surgeon for additional information. Document Released: 08/14/2003 Document Revised: 08/16/2011 Document Reviewed: 11/20/2008 [facility] Patient Information 2014 [place], [place].  Patellofemoral Pain Your exam shows your knee pain is probably due to a problem with the knee cap, the patella. This problem is also called patellofemoral pain, runner's knee, or chondromalacia. Most of the time, this problem is due to overuse of the knee joint. Repeated bending and straightening can irritate the underside of the knee cap. When this happens, activities such as running, walking, climbing, biking or jumping usually produce pain. Pain may also occur after prolonged sitting. Other patellofemoral symptoms can include joint stiffness, swelling, and a snapping or grinding sensation with movement. Rest and rehabilitation are usually successful in treating this problem. Surgery is rarely needed. Treatment includes correcting any mechanical factors that could hurt the normal working of the knee. This could be weak thigh muscles or foot problems. Avoid repetitive activities of the knee until the pain and other symptoms improve.  Apply ice packs over the knee for 20 to 30 minutes every 2 to 4 hours to reduce pain and swelling. Only take over-the-counter or prescription medicines for pain, discomfort, or fever as directed by your caregiver. Knee braces or neoprene sleeves may help reduce irritation. Rehabilitation exercises to strengthen the quad muscle are often prescribed when  your symptoms are better. Call your caregiver for a follow-up exam to evaluate your response to treatment. Document Released: 07/01/2004 Document Revised: 08/16/2011 Document Reviewed: 05/24/2005 [facility] Patient Information 2014 [place].

## 2013-08-06 NOTE — ED Notes (Signed)
Pt reports left knee swelling, and pain since Saturday. Pt sts unable to bear weight.

## 2013-08-06 NOTE — ED Provider Notes (Signed)
Medical screening examination/treatment/procedure(s) were performed by non-physician practitioner and as supervising physician I was immediately available for consultation/collaboration.   EKG Interpretation None        Blanchie Dessert, MD 08/06/13 1516

## 2013-08-06 NOTE — ED Provider Notes (Signed)
CSN: [id number]     Arrival date & time 08/06/13  1139 History  This chart was scribed for non-physician practitioner Delos Haring, PA-C working with Blanchie Dessert, MD by Zettie Pho, ED Scribe. This patient was seen in room WTR6/WTR6 and the patient's care was started at 1:04 PM.    Chief Complaint  Patient presents with  . Knee Pain   The history is provided by the patient. No language interpreter was used.   HPI Comments: Cheryl Crane is a 28 y.o. female who presents to the Emergency Department complaining of a constant pain with associated swelling to the left knee onset 3 days ago that she states has been progressively worsening. She denies any potential injury or trauma to the area. Patient states that the pain is exacerbated with walking/bearing weight and any type of touch/applied pressure. She reports applying [facility], [place], and ice to the area at home without significant relief. Patient denies taking any other medications at home to treat her symptoms. She denies familial history of gout. Patient has a history of asthma.   Past Medical History  Diagnosis Date  . Asthma   . Bronchitis    History reviewed. No pertinent past surgical history. Family History  Problem Relation Age of Onset  . Diabetes Mother   . Heart disease Father    History  Substance Use Topics  . Smoking status: Current Some Day Smoker -- 0.15 packs/day    Types: Cigarettes  . Smokeless tobacco: Not on file  . Alcohol Use: No   OB History   Grav Para Term Preterm Abortions TAB SAB Ect Mult Living   1              Review of Systems  Musculoskeletal: Positive for arthralgias and joint swelling.  All other systems reviewed and are negative.   Allergies  Review of patient's allergies indicates no known allergies.  Home Medications   Current Outpatient Rx  Name  Route  Sig  Dispense  Refill  . albuterol (PROVENTIL HFA;VENTOLIN HFA) 108 (90 BASE) MCG/ACT inhaler   Inhalation   Inhale 1-2  puffs into the lungs every 6 (six) hours as needed for wheezing.   1 Inhaler   0   . danazol (DANOCRINE) 200 MG capsule   Oral   Take 200 mg by mouth 2 (two) times daily.         Marland Kitchen letrozole (FEMARA) 2.5 MG tablet   Oral   Take 2.5 mg by mouth daily.         . traMADol (ULTRAM) 50 MG tablet   Oral   Take 1 tablet (50 mg total) by mouth every 6 (six) hours as needed.   20 tablet   0    Triage Vitals: BP 145/71  Pulse 80  Temp(Src) 98.3 F (36.8 C) (Oral)  Resp 16  SpO2 97%  LMP 04/07/2013  Physical Exam  Nursing note and vitals reviewed. Constitutional: She is oriented to person, place, and time. She appears well-developed and well-nourished. No distress.  HENT:  Head: Normocephalic and atraumatic.  Eyes: Conjunctivae are normal.  Neck: Normal range of motion. Neck supple.  Pulmonary/Chest: Effort normal. No respiratory distress.  Abdominal: She exhibits no distension.  Musculoskeletal:       Left knee: She exhibits decreased range of motion and swelling. She exhibits no deformity. Tenderness found. Medial joint line and lateral joint line tenderness noted.  Swelling to the left knee with medial and lateral point tenderness. Mild  posterior pain. Decreased range of motion secondary to pain. Strong DP pulses.   Neurological: She is alert and oriented to person, place, and time.  Skin: Skin is warm and dry.  Psychiatric: She has a normal mood and affect. Her behavior is normal.    ED Course  Procedures (including critical care time)  DIAGNOSTIC STUDIES: Oxygen Saturation is 97% on room air, normal by my interpretation.    COORDINATION OF CARE: 1:07 PM- Discussed that x-ray results were negative. Will discharge patient with a knee splint, crutches, and pain medication to manage symptoms. Advised patient to apply ice to the area at home. Advised her to follow up with the referred orthopedist, especially if symptoms do not improve in about a week. Discussed treatment  plan with patient at bedside and patient verbalized agreement.     Labs Review Labs Reviewed - No data to display  Imaging Review Dg Knee Complete 4 Views Left  08/06/2013   CLINICAL DATA:  Knee pain.  EXAM: LEFT KNEE - COMPLETE 4+ VIEW  COMPARISON:  No priors.  FINDINGS: Four views of the left knee demonstrate no acute displaced fracture, subluxation, dislocation, joint or soft tissue abnormality.  IMPRESSION: 1. No acute radiographic abnormality of the left knee.   Electronically Signed   By: Vinnie Langton M.D.   On: 08/06/2013 12:55     EKG Interpretation None      MDM   Final diagnoses:  Knee pain    Crutches and knee sleeve given. PT to follow-up with Ortho. Ultram Rx  29 y.o.[name]'s evaluation in the Emergency Department is complete. It has been determined that no acute conditions requiring further emergency intervention are present at this time. The patient/guardian have been advised of the diagnosis and plan. We have discussed signs and symptoms that warrant return to the ED, such as changes or worsening in symptoms.  Vital signs are stable at discharge. Filed Vitals:   08/06/13 1151  BP: 145/71  Pulse: 80  Temp: 98.3 F (36.8 C)  Resp: 16    Patient/guardian has voiced understanding and agreed to follow-up with the PCP or specialist.     Linus Mako, PA-C 08/06/13 1323

## 2014-04-08 ENCOUNTER — Encounter (HOSPITAL_COMMUNITY): Payer: Self-pay | Admitting: Emergency Medicine

## 2015-05-05 ENCOUNTER — Emergency Department (HOSPITAL_COMMUNITY)
Admission: EM | Admit: 2015-05-05 | Discharge: 2015-05-05 | Disposition: A | Discharge status: Home / Self Care | Payer: Medicaid Other | Attending: Emergency Medicine | Admitting: Emergency Medicine

## 2015-05-05 ENCOUNTER — Encounter (HOSPITAL_COMMUNITY): Payer: Self-pay | Admitting: Emergency Medicine

## 2015-05-05 DIAGNOSIS — K046 Periapical abscess with sinus: Secondary | ICD-10-CM | POA: Insufficient documentation

## 2015-05-05 DIAGNOSIS — K047 Periapical abscess without sinus: Secondary | ICD-10-CM

## 2015-05-05 DIAGNOSIS — Z79899 Other long term (current) drug therapy: Secondary | ICD-10-CM | POA: Insufficient documentation

## 2015-05-05 DIAGNOSIS — Z7952 Long term (current) use of systemic steroids: Secondary | ICD-10-CM | POA: Insufficient documentation

## 2015-05-05 DIAGNOSIS — J45909 Unspecified asthma, uncomplicated: Secondary | ICD-10-CM | POA: Insufficient documentation

## 2015-05-05 DIAGNOSIS — F17210 Nicotine dependence, cigarettes, uncomplicated: Secondary | ICD-10-CM | POA: Insufficient documentation

## 2015-05-05 DIAGNOSIS — K0381 Cracked tooth: Secondary | ICD-10-CM | POA: Insufficient documentation

## 2015-05-05 MED ORDER — CLINDAMYCIN HCL 150 MG PO CAPS: 150.0000 mg | ORAL_CAPSULE | Freq: Four times a day (QID) | ORAL | Status: DC

## 2015-05-05 NOTE — ED Notes (Signed)
Pt c/o swelling and pain in her R cheek and jaw from an abscessed tooth. Pt had a R lower tooth chip off "awhile ago" and about a week ago started having pain in her jaw. Pt A&Ox4 and ambulatory.

## 2015-05-05 NOTE — Discharge Instructions (Signed)

## 2015-05-05 NOTE — ED Provider Notes (Signed)
CSN: [id number]     Arrival date & time 05/05/15  1636 History   By signing my name below, I, Forrestine Him, attest that this documentation has been prepared under the direction and in the presence of Glendell Docker, NP.  Electronically Signed: Forrestine Him, ED Scribe. 05/05/2015. 6:23 PM.   Chief Complaint  Patient presents with  . Dental Pain  . Abscess   The history is provided by the patient. No language interpreter was used.    HPI Comments: Cheryl Crane is a 29 y.o. female without any pertinent past medical history who presents to the Emergency Department complaining of constant, ongoing swelling to the R cheek and jaw x 3 days. Pt states her R lower tooth chipped off "a while ago" and believes symptoms may be related. No aggravating or alleviating factors at this time. No recent fever, chills, nausea, vomiting, or inability to swallow. She is not currently followed by a dentist but plans to establish to have her tooth evaluated. No known allergies to medications.  PCP: No primary care provider on file.    Past Medical History  Diagnosis Date  . Asthma   . Bronchitis    History reviewed. No pertinent past surgical history. Family History  Problem Relation Age of Onset  . Diabetes Mother   . Heart disease Father    Social History  Substance Use Topics  . Smoking status: Current Some Day Smoker -- 0.15 packs/day    Types: Cigarettes  . Smokeless tobacco: None  . Alcohol Use: No   OB History    Gravida Para Term Preterm AB TAB SAB Ectopic Multiple Living   1              Review of Systems  A complete 10 system review of systems was obtained and all systems are negative except as noted in the HPI and PMH.    Allergies  Review of patient's allergies indicates no known allergies.  Home Medications   Prior to Admission medications   Medication Sig Start Date End Date Taking? Authorizing Provider  albuterol (PROVENTIL HFA;VENTOLIN HFA) 108 (90 BASE) MCG/ACT  inhaler Inhale 1-2 puffs into the lungs every 6 (six) hours as needed for wheezing. 06/19/12   Wandra Arthurs, MD  danazol (DANOCRINE) 200 MG capsule Take 200 mg by mouth 2 (two) times daily.    Historical Provider, MD  letrozole (FEMARA) 2.5 MG tablet Take 2.5 mg by mouth daily.    Historical Provider, MD  traMADol (ULTRAM) 50 MG tablet Take 1 tablet (50 mg total) by mouth every 6 (six) hours as needed. 08/06/13   Delos Haring, PA-C   Triage Vitals: BP 123/69 mmHg  Pulse 82  Temp(Src) 98.2 F (36.8 C) (Oral)  Resp 18  SpO2 99%  LMP 04/05/2015   Physical Exam  Constitutional: She is oriented to person, place, and time. She appears well-developed and well-nourished.  HENT:  Head: Normocephalic and atraumatic.  Right Ear: External ear normal.  Left Ear: External ear normal.  Mouth/Throat:    Obvious swelling to the right lower jaw  Eyes: EOM are normal.  Neck: Normal range of motion.  Cardiovascular: Normal rate and regular rhythm.   Pulmonary/Chest: Effort normal.  Abdominal: She exhibits no distension.  Musculoskeletal: Normal range of motion.  Neurological: She is alert and oriented to person, place, and time.  Psychiatric: She has a normal mood and affect.  Nursing note and vitals reviewed.   ED Course  Procedures (including critical care  time)  DIAGNOSTIC STUDIES: Oxygen Saturation is 00% on RA, Normal by my interpretation.    COORDINATION OF CARE: 6:22 PM- Will start on antibiotic and discharge home. Encouraged follow up with dentist. Discussed treatment plan with pt at bedside and pt agreed to plan.     Labs Review Labs Reviewed - No data to display  Imaging Review No results found. I have personally reviewed and evaluated these images and lab results as part of my medical decision-making.   EKG Interpretation None      MDM   Final diagnoses:  Dental abscess    No sign of ludwigs angina or airway compromise. Pt given clindamycin and dental follow up  I  personally performed the services described in this documentation, which was scribed in my presence. The recorded information has been reviewed and is accurate.   Glendell Docker, NP 05/05/15 [place], MD 05/06/15 0000

## 2015-06-07 ENCOUNTER — Emergency Department (HOSPITAL_COMMUNITY): Payer: Medicaid Other

## 2015-06-07 ENCOUNTER — Encounter (HOSPITAL_COMMUNITY): Payer: Self-pay

## 2015-06-07 ENCOUNTER — Observation Stay (HOSPITAL_COMMUNITY)
Admission: EM | Admit: 2015-06-07 | Discharge: 2015-06-08 | Disposition: A | Discharge status: Home / Self Care | Payer: Medicaid Other | Attending: Internal Medicine | Admitting: Internal Medicine

## 2015-06-07 DIAGNOSIS — J45909 Unspecified asthma, uncomplicated: Secondary | ICD-10-CM | POA: Diagnosis present

## 2015-06-07 DIAGNOSIS — D259 Leiomyoma of uterus, unspecified: Secondary | ICD-10-CM | POA: Diagnosis present

## 2015-06-07 DIAGNOSIS — D649 Anemia, unspecified: Secondary | ICD-10-CM | POA: Diagnosis present

## 2015-06-07 DIAGNOSIS — D509 Iron deficiency anemia, unspecified: Secondary | ICD-10-CM | POA: Insufficient documentation

## 2015-06-07 DIAGNOSIS — D500 Iron deficiency anemia secondary to blood loss (chronic): Principal | ICD-10-CM | POA: Insufficient documentation

## 2015-06-07 LAB — CBC WITH DIFFERENTIAL/PLATELET
BASOS PCT: 0 %
Basophils Absolute: 0 10*3/uL (ref 0.0–0.1)
Eosinophils Absolute: 0 10*3/uL (ref 0.0–0.7)
Eosinophils Relative: 0 %
HEMATOCRIT: 20.5 % — AB (ref 36.0–46.0)
HEMOGLOBIN: 5.7 g/dL — AB (ref 12.0–15.0)
LYMPHS PCT: 17 %
Lymphs Abs: 1.3 10*3/uL (ref 0.7–4.0)
MCH: 15.8 pg — AB (ref 26.0–34.0)
MCHC: 27.8 g/dL — AB (ref 30.0–36.0)
MCV: 56.8 fL — AB (ref 78.0–100.0)
MONOS PCT: 17 %
Monocytes Absolute: 1.3 10*3/uL — ABNORMAL HIGH (ref 0.1–1.0)
NEUTROS ABS: 5.1 10*3/uL (ref 1.7–7.7)
Neutrophils Relative %: 66 %
Platelets: 470 10*3/uL — ABNORMAL HIGH (ref 150–400)
RBC: 3.61 MIL/uL — ABNORMAL LOW (ref 3.87–5.11)
RDW: 19.9 % — ABNORMAL HIGH (ref 11.5–15.5)
WBC: 7.7 10*3/uL (ref 4.0–10.5)

## 2015-06-07 LAB — BASIC METABOLIC PANEL
Anion gap: 7 (ref 5–15)
BUN: 10 mg/dL (ref 6–20)
CHLORIDE: 104 mmol/L (ref 101–111)
CO2: 23 mmol/L (ref 22–32)
CREATININE: 0.6 mg/dL (ref 0.44–1.00)
Calcium: 8.6 mg/dL — ABNORMAL LOW (ref 8.9–10.3)
GFR calc non Af Amer: >60 mL/min (ref >60)
GLUCOSE: 99 mg/dL (ref 65–99)
Potassium: 3.7 mmol/L (ref 3.5–5.1)
Sodium: 134 mmol/L — ABNORMAL LOW (ref 135–145)

## 2015-06-07 LAB — PROTIME-INR
INR: 1.12 (ref 0.00–1.49)
Prothrombin Time: 14.6 seconds (ref 11.6–15.2)

## 2015-06-07 LAB — I-STAT BETA HCG BLOOD, ED (MC, WL, AP ONLY): I-stat hCG, quantitative: <5 m[IU]/mL (ref <5)

## 2015-06-07 LAB — CBC
HCT: 22.1 % — ABNORMAL LOW (ref 36.0–46.0)
HEMOGLOBIN: 6.2 g/dL — AB (ref 12.0–15.0)
MCH: 15.7 pg — ABNORMAL LOW (ref 26.0–34.0)
MCHC: 28.1 g/dL — ABNORMAL LOW (ref 30.0–36.0)
MCV: 55.8 fL — ABNORMAL LOW (ref 78.0–100.0)
PLATELETS: 399 10*3/uL (ref 150–400)
RBC: 3.96 MIL/uL (ref 3.87–5.11)
RDW: 19.8 % — ABNORMAL HIGH (ref 11.5–15.5)
WBC: 7.1 10*3/uL (ref 4.0–10.5)

## 2015-06-07 LAB — PREPARE RBC (CROSSMATCH)

## 2015-06-07 LAB — I-STAT CG4 LACTIC ACID, ED: Lactic Acid, Venous: 1.47 mmol/L (ref 0.5–2.0)

## 2015-06-07 MED ORDER — SODIUM CHLORIDE 0.9 % IV BOLUS (SEPSIS)
1000.0000 mL | Freq: Once | INTRAVENOUS | Status: AC
  Administered 2015-06-07: 1000 mL via INTRAVENOUS

## 2015-06-07 MED ORDER — SODIUM CHLORIDE 0.9 % IV SOLN
10.0000 mL/h | Freq: Once | INTRAVENOUS | Status: AC
  Administered 2015-06-07: 10 mL/h via INTRAVENOUS

## 2015-06-07 MED ORDER — KETOROLAC TROMETHAMINE 30 MG/ML IJ SOLN: 30.0000 mg | Freq: Once | INTRAMUSCULAR | Status: DC

## 2015-06-07 NOTE — ED Notes (Signed)
Pt complains of general body aches for two days

## 2015-06-07 NOTE — ED Provider Notes (Signed)
CSN: [id number]     Arrival date & time 06/07/15  2045 History   First MD Initiated Contact with Patient 06/07/15 2059     Chief Complaint  Patient presents with  . Generalized Body Aches   HPI   Cheryl Crane is a 29 y.o. F PMH significant for asthma and bronchitis presenting with a two day history of generalized body aches. She did have one episode of emesis today (NBNB) but denies nausea currently. She states she has been dizzy (no vertigo) intermittently. She denies fevers, chills, nasal congestion, CP, SOB, abdominal pain, change in bowel/bladder habits.   Past Medical History  Diagnosis Date  . Asthma   . Bronchitis    History reviewed. No pertinent past surgical history. Family History  Problem Relation Age of Onset  . Diabetes Mother   . Heart disease Father    Social History  Substance Use Topics  . Smoking status: Current Some Day Smoker -- 0.15 packs/day    Types: Cigarettes  . Smokeless tobacco: None  . Alcohol Use: No   OB History    Gravida Para Term Preterm AB TAB SAB Ectopic Multiple Living   1              Review of Systems  Ten systems are reviewed and are negative for acute change except as noted in the HPI  Allergies  Review of patient's allergies indicates no known allergies.  Home Medications   Prior to Admission medications   Medication Sig Start Date End Date Taking? Authorizing Provider  ibuprofen (ADVIL,MOTRIN) 200 MG tablet Take 400-600 mg by mouth every 8 (eight) hours as needed (for pain).   Yes Historical Provider, MD  clindamycin (CLEOCIN) 150 MG capsule Take 1 capsule (150 mg total) by mouth every 6 (six) hours. 05/05/15   Teressa Lower, NP   BP 82/62 mmHg  Pulse 96  Temp(Src) 99 F (37.2 C) (Oral)  Resp 20  Ht 5\' 1"  (1.549 m)  Wt 106.595 kg  BMI 44.43 kg/m2  SpO2 100%  LMP 06/07/2015 Physical Exam  Constitutional: She is oriented to person, place, and time. She appears well-developed and well-nourished. No distress.    HENT:  Head: Normocephalic and atraumatic.  Left Ear: External ear normal.  Mouth/Throat: Oropharynx is clear and moist. No oropharyngeal exudate.  Clear effusion behind right TM. Swollen turbinates BL.   Eyes: Conjunctivae are normal. Pupils are equal, round, and reactive to light. Right eye exhibits no discharge. Left eye exhibits no discharge. No scleral icterus.  Neck: No tracheal deviation present.  ROM limited due to patient's "dizziness".   Cardiovascular: Normal rate, regular rhythm, normal heart sounds and intact distal pulses.  Exam reveals no gallop and no friction rub.   No murmur heard. Pulmonary/Chest: Effort normal and breath sounds normal. No respiratory distress. She has no wheezes. She has no rales. She exhibits no tenderness.  Abdominal: Soft. Bowel sounds are normal. She exhibits no distension and no mass. There is no tenderness. There is no rebound and no guarding.  Musculoskeletal: She exhibits no edema or tenderness.  Lymphadenopathy:    She has no cervical adenopathy.  Neurological: She is alert and oriented to person, place, and time. No cranial nerve deficit. Coordination normal.  Skin: Skin is warm and dry. No rash noted. She is not diaphoretic. No erythema.  Psychiatric: She has a normal mood and affect. Her behavior is normal.  Nursing note and vitals reviewed.   ED Course  Procedures Labs Review  I-Stat CG4 Lactic Acid, ED (Final result) Component (Lab Inquiry)    Collection Time Result Time Lactic Acid, Venous   06/07/15 22:02:00 06/07/15 22:07:12 1.47             I-Stat Beta hCG blood, ED ([facility], [facility], [facility] only) (Final result) Component (Lab Inquiry)    Collection Time Result Time I-stat hCG, quantitative Comment 3   06/07/15 21:59:00 06/07/15 22:07:53 <5.0      (Comment)    GEST. AGE   CONC. (mIU/mL)   <=1 WEEK    5 - 50    2 WEEKS    50 - 500    3 WEEKS    100 - 10,000    4 WEEKS   1,000 - 30,000       FEMALE AND NON-PREGNANT FEMALE:    LESS THAN 5 mIU/mL                   CBC with Differential (Final result)Abnormal Component (Lab Inquiry)    Collection Time Result Time WBC RBC HGB HCT MCV   06/07/15 21:50:00 06/07/15 22:58:19 7.7 3.61 (L)  5.7 (LL)    (Comment)    CRITICAL RESULT CALLED TO, READ BACK BY AND VERIFIED WITH:  DOSTER,T RN [phone] COVINGTON,N       20.5 (L) 56.8 (L)      Collection Time Result Time MCH MCHC RDW PLT NEUTRO PCT   06/07/15 21:50:00 06/07/15 22:58:19 15.8 (L) 27.8 (L) 19.9 (H) 470 (H) 66      Collection Time Result Time LYMPHO PCT MONO PCT EOS PCT BASOS PCT AB NEUTRO   06/07/15 21:50:00 06/07/15 22:58:'19 17 17 '$ 0 0 5.1      Collection Time Result Time AB LYM MONO ABS EOSINO ABS BASOS ABS RBC Morphology   06/07/15 21:50:00 06/07/15 22:58:19 1.3 1.3 (H) 0.0 0.0 POLYCHROMASIA PRESENT TARGET CELLS TEARDROP CELLS              Basic metabolic panel (Final result)Abnormal Component (Lab Inquiry)    Collection Time Result Time NA K CL CO2 GLUCOSE   06/07/15 21:50:00 06/07/15 22:24:07 134 (L) 3.7 104 23 99      Collection Time Result Time BUN Creatinine, Ser CALCIUM GFR calc non Af Amer GFR calc Af Amer   06/07/15 21:50:00 06/07/15 22:24:07 10 0.60 8.6 (L) >60  >60    (Comment)    (NOTE)  The eGFR has been calculated using the CKD EPI equation.  This calculation has not been validated in all clinical situations.  eGFR's persistently <60 mL/min signify possible Chronic Kidney  Disease.            Collection Time Result Time Anion gap   06/07/15 21:50:00 06/07/15 22:24:07 7      Imaging Review Dg Chest 2 View  06/07/2015  CLINICAL DATA:  Acute onset of generalized body aches and pains. Fever, nausea, vomiting and shortness of breath. Hypotension. Initial encounter. EXAM: CHEST  2 VIEW COMPARISON:  Chest radiograph performed 06/19/2012 FINDINGS: The lungs are well-aerated and clear. There  is no evidence of focal opacification, pleural effusion or pneumothorax. The heart is normal in size; the mediastinal contour is within normal limits. No acute osseous abnormalities are seen. IMPRESSION: No acute cardiopulmonary process seen. Electronically Signed   By: Garald Balding M.D.   On: 06/07/2015 22:41   I have personally reviewed and evaluated these images and lab results as part of my medical decision-making.   MDM   Final diagnoses:  Symptomatic anemia   Patient lethargic, pale appearing on exam. Hypotensive and near tachycardia on initial evaluation.  Dr. Oleta Mouse evaluated patient as well. Hgb of 5.7. Patient admits to being on menstrual cycle, with heavy flow at baseline, and she does have fibroids. Will discuss transfusion and order 2 units of blood. Mother requesting repeat hgb prior to transfusion. Hgb 6.2 on repeat lab.  CXR, BMP, hcg unremarkable.  Patient will need to be admitted for symptomatic anemia. Hospitalist agrees to admission. Patient understanding and in agreement with plan.     Thurmond Lions, PA-C 06/15/15 2348  Forde Dandy, MD 06/16/15 [phone]

## 2015-06-07 NOTE — ED Notes (Signed)
Nurse drawing labs. 

## 2015-06-07 NOTE — ED Provider Notes (Signed)
Medical screening examination/treatment/procedure(s) were conducted as a shared visit with non-physician practitioner(s) and myself.  I personally evaluated the patient during the encounter.   EKG Interpretation None      29 year old female who presents with generalized body aches. History of asthma. States feeling unwell for the past 2 to 3 days with generalized body aches and pains. No known sick contacts. Primarily states feeling very lightheaded whenever she moves her head. Near tachycardia here, initially with hypotension 80s SBP, responsive to IVF. No fever and in no respiratory distress. Exam overall unremarkable. Blood work c/f hgb 5.7 then 6.2. Presenting suggestive of symptomatic anemia. Currently on menses with history of menorrhagia and fibroids, likely source. Will transfuse and admit for further management.  CRITICAL CARE Performed by: Forde Dandy   Total critical care time: 30 minutes  Critical care time was exclusive of separately billable procedures and treating other patients.  Critical care was necessary to treat or prevent imminent or life-threatening deterioration.  Critical care was time spent personally by me on the following activities: development of treatment plan with patient and/or surrogate as well as nursing, discussions with consultants, evaluation of patient's response to treatment, examination of patient, obtaining history from patient or surrogate, ordering and performing treatments and interventions, ordering and review of laboratory studies, ordering and review of radiographic studies, pulse oximetry and re-evaluation of patient's condition.   Forde Dandy, MD 06/10/15 [phone]

## 2015-06-08 ENCOUNTER — Encounter (HOSPITAL_COMMUNITY): Payer: Self-pay | Admitting: Internal Medicine

## 2015-06-08 DIAGNOSIS — J45909 Unspecified asthma, uncomplicated: Secondary | ICD-10-CM | POA: Diagnosis present

## 2015-06-08 DIAGNOSIS — D649 Anemia, unspecified: Secondary | ICD-10-CM

## 2015-06-08 HISTORY — DX: Anemia, unspecified: D64.9

## 2015-06-08 HISTORY — DX: Unspecified asthma, uncomplicated: J45.909

## 2015-06-08 LAB — IRON AND TIBC
IRON: 27 ug/dL — AB (ref 28–170)
Saturation Ratios: 7 % — ABNORMAL LOW (ref 10.4–31.8)
TIBC: 395 ug/dL (ref 250–450)
UIBC: 368 ug/dL

## 2015-06-08 LAB — FERRITIN: FERRITIN: 17 ng/mL (ref 11–307)

## 2015-06-08 LAB — CBC
HCT: 27 % — ABNORMAL LOW (ref 36.0–46.0)
Hemoglobin: 7.9 g/dL — ABNORMAL LOW (ref 12.0–15.0)
MCH: 17.8 pg — ABNORMAL LOW (ref 26.0–34.0)
MCHC: 29.3 g/dL — ABNORMAL LOW (ref 30.0–36.0)
MCV: 60.7 fL — ABNORMAL LOW (ref 78.0–100.0)
Platelets: 363 10*3/uL (ref 150–400)
RBC: 4.45 MIL/uL (ref 3.87–5.11)
RDW: 25.4 % — ABNORMAL HIGH (ref 11.5–15.5)
WBC: 8.4 10*3/uL (ref 4.0–10.5)

## 2015-06-08 LAB — MAGNESIUM: Magnesium: 2.1 mg/dL (ref 1.7–2.4)

## 2015-06-08 LAB — BASIC METABOLIC PANEL WITH GFR
Anion gap: 9 (ref 5–15)
BUN: 10 mg/dL (ref 6–20)
CO2: 23 mmol/L (ref 22–32)
Calcium: 8.5 mg/dL — ABNORMAL LOW (ref 8.9–10.3)
Chloride: 107 mmol/L (ref 101–111)
Creatinine, Ser: 0.56 mg/dL (ref 0.44–1.00)
GFR calc Af Amer: >60 mL/min
GFR calc non Af Amer: >60 mL/min
Glucose, Bld: 104 mg/dL — ABNORMAL HIGH (ref 65–99)
Potassium: 3.8 mmol/L (ref 3.5–5.1)
Sodium: 139 mmol/L (ref 135–145)

## 2015-06-08 MED ORDER — SODIUM CHLORIDE 0.9 % IV SOLN
10.0000 mL/h | Freq: Once | INTRAVENOUS | Status: AC
  Administered 2015-06-08: 10 mL/h via INTRAVENOUS

## 2015-06-08 MED ORDER — ONDANSETRON HCL 4 MG/2ML IJ SOLN
4.0000 mg | Freq: Once | INTRAMUSCULAR | Status: AC
  Administered 2015-06-08: 4 mg via INTRAVENOUS
  Filled 2015-06-08: qty 2

## 2015-06-08 MED ORDER — FERROUS GLUCONATE 324 (38 FE) MG PO TABS
324.0000 mg | ORAL_TABLET | Freq: Three times a day (TID) | ORAL | Status: DC
  Administered 2015-06-08: 324 mg via ORAL
  Filled 2015-06-08 (×5): qty 1

## 2015-06-08 MED ORDER — KETOROLAC TROMETHAMINE 30 MG/ML IJ SOLN
30.0000 mg | Freq: Once | INTRAMUSCULAR | Status: AC
  Administered 2015-06-08: 30 mg via INTRAVENOUS
  Filled 2015-06-08: qty 1

## 2015-06-08 MED ORDER — FAMOTIDINE 20 MG PO TABS
20.0000 mg | ORAL_TABLET | Freq: Two times a day (BID) | ORAL | Status: DC
  Administered 2015-06-08: 20 mg via ORAL
  Filled 2015-06-08 (×2): qty 1

## 2015-06-08 MED ORDER — METHOCARBAMOL 1000 MG/10ML IJ SOLN
500.0000 mg | Freq: Once | INTRAVENOUS | Status: DC
  Filled 2015-06-08: qty 5

## 2015-06-08 MED ORDER — OXYCODONE HCL 5 MG PO TABS
5.0000 mg | ORAL_TABLET | Freq: Once | ORAL | Status: AC
  Administered 2015-06-08: 5 mg via ORAL
  Filled 2015-06-08: qty 1

## 2015-06-08 MED ORDER — SODIUM CHLORIDE 0.9 % IJ SOLN
3.0000 mL | Freq: Two times a day (BID) | INTRAMUSCULAR | Status: DC
  Administered 2015-06-08: 3 mL via INTRAVENOUS

## 2015-06-08 MED ORDER — FERROUS SULFATE 325 (65 FE) MG PO TABS: 325.0000 mg | ORAL_TABLET | Freq: Two times a day (BID) | ORAL | Status: DC

## 2015-06-08 NOTE — ED Notes (Signed)
Pt just signed the consent.  RN to get blood from Blood Bank.

## 2015-06-08 NOTE — Discharge Summary (Signed)
[facility]  Physician Discharge Summary   Patient ID: Cheryl Crane MRN: [id number] DOB/AGE: 1985/12/14 30 y.o.  Admit date: 06/07/2015 Discharge date: 06/08/2015  PCP: No PCP Per Patient  DISCHARGE DIAGNOSES:  Principal Problem:   Symptomatic anemia Active Problems:   Fibroid uterus   Asthma   RECOMMENDATIONS FOR OUTPATIENT FOLLOW UP: 1. Patient asked to follow-up with her gynecologist for definitive management of her fibroids   DISCHARGE CONDITION: fair  Diet recommendation: Regular as tolerated  Filed Weights   06/07/15 2056 06/08/15 0240  Weight: 106.595 kg (235 lb) 107 kg (235 lb 14.3 oz)    INITIAL HISTORY: 30 year old African-American female with a past medical history of asthma and uterine fibroids presented with complaints of fatigue, body aches, palpitations. She was found to have a hemoglobin of 5.7. She was hospitalized for further management.  Consultations:  None  Procedures:  Blood transfusion  HOSPITAL COURSE:   Microcytic anemia/acute on chronic blood loss Her severe anemia is due to heavy menstrual periods due to her fibroids. Patient was hospitalized and was transfused PRBCs. Her hemoglobin has responded appropriately. She was ambulating in the hallway without any difficulties. She knows about her uterine fibroids and has seen gynecology in the past, however, could not follow-up due to loss of insurance coverage. She has been told to seek further attention regarding this matter. Anemia panel was not sent. Most likely this patient has iron deficiency. She'll be prescribed ferrous sulfate.  Uterine fibroids Patient advised to follow-up with the gynecologist.   Overall, patient remains stable. Okay for discharge today.  PERTINENT LABS:  The results of significant diagnostics from this hospitalization (including imaging, microbiology, ancillary and laboratory) are listed below for reference.      Labs: Basic Metabolic  Panel:  Recent Labs Lab 06/07/15 2150 06/08/15 1025  NA 134* 139  K 3.7 3.8  CL 104 107  CO2 23 23  GLUCOSE 99 104*  BUN 10 10  CREATININE 0.60 0.56  CALCIUM 8.6* 8.5*  MG  --  2.1   CBC:  Recent Labs Lab 06/07/15 2150 06/07/15 2300 06/08/15 1025  WBC 7.7 7.1 8.4  NEUTROABS 5.1  --   --   HGB 5.7* 6.2* 7.9*  HCT 20.5* 22.1* 27.0*  MCV 56.8* 55.8* 60.7*  PLT 470* 399 363     IMAGING STUDIES Dg Chest 2 View  06/07/2015  CLINICAL DATA:  Acute onset of generalized body aches and pains. Fever, nausea, vomiting and shortness of breath. Hypotension. Initial encounter. EXAM: CHEST  2 VIEW COMPARISON:  Chest radiograph performed 06/19/2012 FINDINGS: The lungs are well-aerated and clear. There is no evidence of focal opacification, pleural effusion or pneumothorax. The heart is normal in size; the mediastinal contour is within normal limits. No acute osseous abnormalities are seen. IMPRESSION: No acute cardiopulmonary process seen. Electronically Signed   By: Garald Balding M.D.   On: 06/07/2015 22:41    DISCHARGE EXAMINATION: Filed Vitals:   06/08/15 0412 06/08/15 0459 06/08/15 0515 06/08/15 0730  BP: 111/42 110/57 118/65 124/63  Pulse: 79 87 84 83  Temp: 98.4 F (36.9 C) 98.4 F (36.9 C) 98.6 F (37 C) 98.7 F (37.1 C)  TempSrc: Oral Oral Oral Oral  Resp: 21 22 20 20   Height:      Weight:      SpO2: 100% 100% 100% 100%   General appearance: alert, cooperative, appears stated age and no distress Resp: clear to auscultation bilaterally Cardio: regular rate and rhythm, S1, S2 normal,  no murmur, click, rub or gallop GI: Soft. Tender and fullness in the lower abdomen. Likely due to her fibroids. No rebound, rigidity or guarding. Extremities: extremities normal, atraumatic, no cyanosis or edema Neurologic: Alert and oriented X 3, normal strength and tone. Normal symmetric reflexes. Normal coordination and gait  DISPOSITION: Home  Discharge Instructions    Call MD  for:  difficulty breathing, headache or visual disturbances    Complete by:  As directed      Call MD for:  extreme fatigue    Complete by:  As directed      Call MD for:  persistant dizziness or light-headedness    Complete by:  As directed      Call MD for:  persistant nausea and vomiting    Complete by:  As directed      Call MD for:  severe uncontrolled pain    Complete by:  As directed      Call MD for:  temperature >100.4    Complete by:  As directed      Discharge instructions    Complete by:  As directed   Please follow up with a Gyn doctor for your fibroids which is the reason for your anemia (low blood counts).   You were cared for by a hospitalist during your hospital stay. If you have any questions about your discharge medications or the care you received while you were in the hospital after you are discharged, you can call the unit and asked to speak with the hospitalist on call if the hospitalist that took care of you is not available. Once you are discharged, your primary care physician will handle any further medical issues. Please note that NO REFILLS for any discharge medications will be authorized once you are discharged, as it is imperative that you return to your primary care physician (or establish a relationship with a primary care physician if you do not have one) for your aftercare needs so that they can reassess your need for medications and monitor your lab values. If you do not have a primary care physician, you can call [phone] for a physician referral.     Increase activity slowly    Complete by:  As directed      Other Restrictions    Complete by:  As directed   Cheryl Crane may return to work on 06/11/15.           ALLERGIES: No Known Allergies   Current Discharge Medication List    START taking these medications   Details  ferrous sulfate (FEOSOL) 325 (65 FE) MG tablet Take 1 tablet (325 mg total) by mouth 2 (two) times daily with a meal. Qty: 60 tablet,  Refills: 3      CONTINUE these medications which have NOT CHANGED   Details  ibuprofen (ADVIL,MOTRIN) 200 MG tablet Take 400-600 mg by mouth every 8 (eight) hours as needed (for pain).    Ibuprofen-Diphenhydramine HCl (ADVIL PM) 200-25 MG CAPS Take 2 tablets by mouth at bedtime as needed (for sleep).      STOP taking these medications     clindamycin (CLEOCIN) 150 MG capsule          TOTAL DISCHARGE TIME: 35 minutes  [facility] Pager [phone]  06/08/2015, 3:13 PM

## 2015-06-08 NOTE — Progress Notes (Signed)
Notified physician that patient has not eaten lunch after I asked her an hour ago to order and has not walked in hall.  Patient reports pain in lower back of 8/10.  Physician reports tell patient to get up and move around, walk in hall, and then I am to call him.  Physician ordering one time dose of pain medication.  Notified patient.

## 2015-06-08 NOTE — H&P (Signed)
[facility] History and Physical  Cheryl Crane [id number] DOB: 08-28-1985 DOA: 06/07/2015  Referring physician: Forde Dandy, MD PCP: No PCP Per Patient   Chief Complaint: Body aches.  HPI: Cheryl Crane is a 30 y.o. female  with a past medical history of asthma, uterine fibroid who comes to the ER with above chief complaint. She denies fever, chills, cough, sore throat, earaches, rhinorrhea. She complains of dizziness, palpitations, and fatigue. She denies CP, diaphoresis, PND or lower extremity edema.   When seen, the patient was not in any acute distress. Work up in the ER shows a Hb level of 5.7 gr/dL. She reports heavy menstrual bleed and a history of uterine fibroids. She has not followed with GYN because she lost her medicaid coverage. She does not take iron supplementation and tried OTC ferrous sulfate, but states it gave her severe gastric intolerance.    Review of Systems:  Constitutional:  Positive fatigue. No weight loss, night sweats, Fevers, chills.  HEENT:  No headaches, Difficulty swallowing,Tooth/dental problems,Sore throat. No sneezing, itching, ear ache, nasal congestion, post nasal drip. Cardio-vascular:  Positive dizziness, palpitations.  No chest pain, Orthopnea, PND, swelling in lower extremities, anasarca.  GI:  Positive nausea, vomiting x1. No nausea now. No heartburn, indigestion, abdominal pain,  diarrhea, change in bowel habits, loss of appetite  Resp:  Mild dyspnea. No cough, wheezing, hemoptysis.  Skin:  no rash or lesions.  GU:  Positive heavy menstrual bleed, history of uterine fibroid.  no dysuria, change in color of urine, no urgency or frequency. No flank pain.  Musculoskeletal:   No joint pain or swelling. No decreased range of motion. No back pain.  Psych:  No change in mood or affect. No depression or anxiety. No memory loss.   Past Medical History  Diagnosis Date  . Asthma   . Bronchitis    History reviewed. No  pertinent past surgical history. Social History:  reports that she has been smoking Cigarettes.  She has been smoking about 0.15 packs per day. She does not have any smokeless tobacco history on file. She reports that she does not drink alcohol or use illicit drugs.  No Known Allergies  Family History  Problem Relation Age of Onset  . Diabetes Mother   . Heart disease Father     Prior to Admission medications   Medication Sig Start Date End Date Taking? Authorizing Provider  ibuprofen (ADVIL,MOTRIN) 200 MG tablet Take 400-600 mg by mouth every 8 (eight) hours as needed (for pain).   Yes Historical Provider, MD  Ibuprofen-Diphenhydramine HCl (ADVIL PM) 200-25 MG CAPS Take 2 tablets by mouth at bedtime as needed (for sleep).   Yes Historical Provider, MD  clindamycin (CLEOCIN) 150 MG capsule Take 1 capsule (150 mg total) by mouth every 6 (six) hours. 05/05/15   Glendell Docker, NP   Physical Exam: Filed Vitals:   06/08/15 0034 06/08/15 0039 06/08/15 0059 06/08/15 0118  BP: 106/55 106/55 106/55 115/57  Pulse: 92 89 87 93  Temp: 99.5 F (37.5 C)  99.5 F (37.5 C) 99.3 F (37.4 C)  TempSrc: Oral  Oral Oral  Resp:  17 17 21   Height:      Weight:      SpO2: 97% 96% 96% 99%    Wt Readings from Last 3 Encounters:  06/07/15 106.595 kg (235 lb)  08/16/12 96.888 kg (213 lb 9.6 oz)  08/09/12 97.977 kg (216 lb)    General:  Appears calm and comfortable  Eyes: PERRL, pale conjunctiva.  ENT: grossly normal hearing, oral mucosa is moist. Neck: no LAD, masses or thyromegaly Cardiovascular: RRR, no m/r/g. No LE edema. Telemetry: SR, no arrhythmias  Respiratory: CTA bilaterally, no w/r/r. Normal respiratory effort. Abdomen: soft, ntnd Skin: no rash or induration seen on limited exam Musculoskeletal: grossly normal tone BUE/BLE Psychiatric: grossly normal mood and affect, speech fluent and appropriate Neurologic: grossly non-focal.          Labs on Admission:  Basic Metabolic  Panel:  Recent Labs Lab 06/07/15 2150  NA 134*  K 3.7  CL 104  CO2 23  GLUCOSE 99  BUN 10  CREATININE 0.60  CALCIUM 8.6*   CBC:  Recent Labs Lab 06/07/15 2150 06/07/15 2300  WBC 7.7 7.1  NEUTROABS 5.1  --   HGB 5.7* 6.2*  HCT 20.5* 22.1*  MCV 56.8* 55.8*  PLT 470* 399    Radiological Exams on Admission: Dg Chest 2 View  06/07/2015  CLINICAL DATA:  Acute onset of generalized body aches and pains. Fever, nausea, vomiting and shortness of breath. Hypotension. Initial encounter. EXAM: CHEST  2 VIEW COMPARISON:  Chest radiograph performed 06/19/2012 FINDINGS: The lungs are well-aerated and clear. There is no evidence of focal opacification, pleural effusion or pneumothorax. The heart is normal in size; the mediastinal contour is within normal limits. No acute osseous abnormalities are seen. IMPRESSION: No acute cardiopulmonary process seen. Electronically Signed   By: Garald Balding M.D.   On: 06/07/2015 22:41    Assessment/Plan Principal Problem:   Symptomatic anemia Admit to telemetry. Transfuse two units of PRBCs. Follow up H/H in am. Ferrous fumarate oral trial. Advised to take iron supplementation regularly.  Active Problems:   Fibroid uterus I advised the patient to follow up with GYN.    Asthma Bronchodilators as needed.    Code Status: Full code. DVT Prophylaxis: SCDs. Family Communication: Her mother was by bedside. Disposition Plan: Admit for blood transfusion.  Time spent: Over 70 minutes were spent during the process of this admission.    Reubin Milan [facility] Pager [phone].

## 2015-06-08 NOTE — ED Notes (Signed)
Tried to call report.  RN is in a contact room and will call this ED RD back shortly.

## 2015-06-08 NOTE — Discharge Instructions (Signed)
Anemia, Nonspecific Anemia is a condition in which the concentration of red blood cells or hemoglobin in the blood is below normal. Hemoglobin is a substance in red blood cells that carries oxygen to the tissues of the body. Anemia results in not enough oxygen reaching these tissues.  CAUSES  Common causes of anemia include:   Excessive bleeding. Bleeding may be internal or external. This includes excessive bleeding from periods (in women) or from the intestine.   Poor nutrition.   Chronic kidney, thyroid, and liver disease.  Bone marrow disorders that decrease red blood cell production.  Cancer and treatments for cancer.  HIV, AIDS, and their treatments.  Spleen problems that increase red blood cell destruction.  Blood disorders.  Excess destruction of red blood cells due to infection, medicines, and autoimmune disorders. SIGNS AND SYMPTOMS   Minor weakness.   Dizziness.   Headache.  Palpitations.   Shortness of breath, especially with exercise.   Paleness.  Cold sensitivity.  Indigestion.  Nausea.  Difficulty sleeping.  Difficulty concentrating. Symptoms may occur suddenly or they may develop slowly.  DIAGNOSIS  Additional blood tests are often needed. These help your health care provider determine the best treatment. Your health care provider will check your stool for blood and look for other causes of blood loss.  TREATMENT  Treatment varies depending on the cause of the anemia. Treatment can include:   Supplements of iron, vitamin 123456, or folic acid.   Hormone medicines.   A blood transfusion. This may be needed if blood loss is severe.   Hospitalization. This may be needed if there is significant continual blood loss.   Dietary changes.  Spleen removal. HOME CARE INSTRUCTIONS Keep all follow-up appointments. It often takes many weeks to correct anemia, and having your health care provider check on your condition and your response to  treatment is very important. SEEK IMMEDIATE MEDICAL CARE IF:   You develop extreme weakness, shortness of breath, or chest pain.   You become dizzy or have trouble concentrating.  You develop heavy vaginal bleeding.   You develop a rash.   You have bloody or black, tarry stools.   You faint.   You vomit up blood.   You vomit repeatedly.   You have abdominal pain.  You have a fever or persistent symptoms for more than 2-3 days.   You have a fever and your symptoms suddenly get worse.   You are dehydrated.  MAKE SURE YOU:  Understand these instructions.  Will watch your condition.  Will get help right away if you are not doing well or get worse.   This information is not intended to replace advice given to you by your health care provider. Make sure you discuss any questions you have with your health care provider.   Document Released: 07/01/2004 Document Revised: 01/24/2013 Document Reviewed: 11/17/2012 Elsevier Interactive Patient Education 2016 Else[employer]   Uterine Fibroids Uterine fibroids are tissue masses (tumors) that can develop in the womb (uterus). They are also called leiomyomas. This type of tumor is not cancerous (benign) and does not spread to other parts of the body outside of the pelvic area, which is between the hip bones. Occasionally, fibroids may develop in the fallopian tubes, in the cervix, or on the support structures (ligaments) that surround the uterus. You can have one or many fibroids. Fibroids can vary in size, weight, and where they grow in the uterus. Some can become quite large. Most fibroids do not require medical treatment.  CAUSES A fibroid can develop when a single uterine cell keeps growing (replicating). Most cells in the human body have a control mechanism that keeps them from replicating without control. SIGNS AND SYMPTOMS Symptoms may include:   Heavy bleeding during your period.  Bleeding or spotting between  periods.  Pelvic pain and pressure.  Bladder problems, such as needing to urinate more often (urinary frequency) or urgently.  Inability to reproduce offspring (infertility).  Miscarriages. DIAGNOSIS Uterine fibroids are diagnosed through a physical exam. Your health care provider may feel the lumpy tumors during a pelvic exam. Ultrasonography and an MRI may be done to determine the size, location, and number of fibroids. TREATMENT Treatment may include:  Watchful waiting. This involves getting the fibroid checked by your health care provider to see if it grows or shrinks. Follow your health care provider's recommendations for how often to have this checked.  Hormone medicines. These can be taken by mouth or given through an intrauterine device (IUD).  Surgery.  Removing the fibroids (myomectomy) or the uterus (hysterectomy).  Removing blood supply to the fibroids (uterine artery embolization). If fibroids interfere with your fertility and you want to become pregnant, your health care provider may recommend having the fibroids removed.  HOME CARE INSTRUCTIONS  Keep all follow-up visits as directed by your health care provider. This is important.  Take medicines only as directed by your health care provider.  If you were prescribed a hormone treatment, take the hormone medicines exactly as directed.  Do not take aspirin, because it can cause bleeding.  Ask your health care provider about taking iron pills and increasing the amount of dark green, leafy vegetables in your diet. These actions can help to boost your blood iron levels, which may be affected by heavy menstrual bleeding.  Pay close attention to your period and tell your health care provider about any changes, such as:  Increased blood flow that requires you to use more pads or tampons than usual per month.  A change in the number of days that your period lasts per month.  A change in symptoms that are associated  with your period, such as abdominal cramping or back pain. SEEK MEDICAL CARE IF:  You have pelvic pain, back pain, or abdominal cramps that cannot be controlled with medicines.  You have an increase in bleeding between and during periods.  You soak tampons or pads in a half hour or less.  You feel lightheaded, extra tired, or weak. SEEK IMMEDIATE MEDICAL CARE IF:  You faint.  You have a sudden increase in pelvic pain.   This information is not intended to replace advice given to you by your health care provider. Make sure you discuss any questions you have with your health care provider.   Document Released: 05/21/2000 Document Revised: 06/14/2014 Document Reviewed: 11/20/2013 Elsevier Interactive Patient Education [employer].

## 2015-06-08 NOTE — Progress Notes (Signed)
Utilization Review Completed.Cheryl Crane T1/06/2015  

## 2015-06-08 NOTE — Progress Notes (Signed)
Patient discharged.  Leaving with significant other.  Room air.  No s/s of pain or distress.  Leaving with one prescription.  Return to work indicated on discharge paperwork.  No complaints.

## 2015-06-08 NOTE — Progress Notes (Signed)
This shift pt arrived to unit room 1510 via stretcher with 1 unit of blood infusing. family members at bedside. VS taken. No c/o pain. unsteady gait and general weakness.  Oriented to room and callbell with no complaints. Initial assessment completed. Will continue to monitor throughout shift.

## 2015-06-09 LAB — TYPE AND SCREEN
ABO/RH(D): A POS
Antibody Screen: NEGATIVE
Unit division: 0
Unit division: 0

## 2015-06-09 LAB — ABO/RH: ABO/RH(D): A POS

## 2015-06-13 LAB — CULTURE, BLOOD (ROUTINE X 2)
Culture: NO GROWTH
Culture: NO GROWTH

## 2017-02-26 ENCOUNTER — Encounter (HOSPITAL_COMMUNITY): Payer: Self-pay | Admitting: *Deleted

## 2017-02-26 DIAGNOSIS — B9789 Other viral agents as the cause of diseases classified elsewhere: Secondary | ICD-10-CM | POA: Insufficient documentation

## 2017-02-26 DIAGNOSIS — R0981 Nasal congestion: Secondary | ICD-10-CM | POA: Insufficient documentation

## 2017-02-26 DIAGNOSIS — J069 Acute upper respiratory infection, unspecified: Secondary | ICD-10-CM | POA: Insufficient documentation

## 2017-02-26 NOTE — ED Triage Notes (Signed)
Pt stated "I started having sore throat, earache, congestion on Tuesday.  Started vomiting Thursday."

## 2017-02-27 ENCOUNTER — Emergency Department (HOSPITAL_COMMUNITY)
Admission: EM | Admit: 2017-02-27 | Discharge: 2017-02-27 | Disposition: A | Discharge status: Home / Self Care | Payer: Self-pay | Attending: Emergency Medicine | Admitting: Emergency Medicine

## 2017-02-27 DIAGNOSIS — J069 Acute upper respiratory infection, unspecified: Secondary | ICD-10-CM

## 2017-02-27 MED ORDER — LORATADINE 10 MG PO TABS: 10.0000 mg | ORAL_TABLET | Freq: Every day | ORAL | 0 refills | Status: DC

## 2017-02-27 MED ORDER — BENZONATATE 100 MG PO CAPS
200.0000 mg | ORAL_CAPSULE | Freq: Once | ORAL | Status: AC
  Administered 2017-02-27: 200 mg via ORAL
  Filled 2017-02-27: qty 2

## 2017-02-27 MED ORDER — ONDANSETRON 8 MG PO TBDP
8.0000 mg | ORAL_TABLET | Freq: Once | ORAL | Status: AC
  Administered 2017-02-27: 8 mg via ORAL
  Filled 2017-02-27: qty 1

## 2017-02-27 MED ORDER — BENZONATATE 100 MG PO CAPS: 100.0000 mg | ORAL_CAPSULE | Freq: Three times a day (TID) | ORAL | 0 refills | Status: DC

## 2017-02-27 MED ORDER — FLUTICASONE PROPIONATE 50 MCG/ACT NA SUSP: 2.0000 | Freq: Every day | NASAL | 0 refills | Status: DC

## 2017-02-27 NOTE — ED Provider Notes (Signed)
[facility] DEPT Provider Note   CSN: [id number] Arrival date & time: 02/26/17  2120     History   Chief Complaint Chief Complaint  Patient presents with  . Cough  . Emesis  . Nasal Congestion    HPI Cheryl Crane is a 31 y.o. female.  The history is provided by the patient.  URI   This is a new problem. The current episode started 2 days ago. The problem has not changed since onset.There has been no fever. Associated symptoms include nausea, vomiting, congestion and plugged ear sensation. Pertinent negatives include no chest pain, no abdominal pain, no diarrhea, no dysuria, no ear pain, no headaches, no rhinorrhea, no sinus pain, no sneezing, no sore throat, no swollen glands, no joint pain, no joint swelling, no neck pain, no rash and no wheezing. She has tried nothing for the symptoms. The treatment provided no relief.    Past Medical History:  Diagnosis Date  . Asthma   . Bronchitis     Patient Active Problem List   Diagnosis Date Noted  . Symptomatic anemia 06/08/2015  . Asthma 06/08/2015  . Fibroid uterus 07/31/2012    History reviewed. No pertinent surgical history.  OB History    Gravida Para Term Preterm AB Living   1             SAB TAB Ectopic Multiple Live Births                   Home Medications    Prior to Admission medications   Medication Sig Start Date End Date Taking? Authorizing Provider  benzonatate (TESSALON) 100 MG capsule Take 1 capsule (100 mg total) by mouth every 8 (eight) hours. 02/27/17   Roquel Burgin, MD  ferrous sulfate (FEOSOL) 325 (65 FE) MG tablet Take 1 tablet (325 mg total) by mouth 2 (two) times daily with a meal. 06/08/15   Bonnielee Haff, MD  fluticasone [facility]) 50 MCG/ACT nasal spray Place 2 sprays into both nostrils daily. 02/27/17   Bion Todorov, MD  ibuprofen (ADVIL,MOTRIN) 200 MG tablet Take 400-600 mg by mouth every 8 (eight) hours as needed (for pain).    [provider]  Ibuprofen-Diphenhydramine  HCl (ADVIL PM) 200-25 MG CAPS Take 2 tablets by mouth at bedtime as needed (for sleep).    [provider]  loratadine (CLARITIN) 10 MG tablet Take 1 tablet (10 mg total) by mouth daily. 02/27/17   Stiven Kaspar, MD    Family History Family History  Problem Relation Age of Onset  . Diabetes Mother   . Heart disease Father     Social History Social History  Substance Use Topics  . Smoking status: Current Some Day Smoker    Packs/day: 0.15    Types: Cigarettes  . Smokeless tobacco: Not on file  . Alcohol use No     Allergies   Patient has no known allergies.   Review of Systems Review of Systems  Constitutional: Negative for fever.  HENT: Positive for congestion. Negative for dental problem, ear pain, rhinorrhea, sinus pain, sneezing and sore throat.   Eyes: Negative for photophobia.  Respiratory: Negative for wheezing.   Cardiovascular: Negative for chest pain.  Gastrointestinal: Positive for nausea and vomiting. Negative for abdominal pain and diarrhea.  Genitourinary: Negative for dysuria.  Musculoskeletal: Negative for joint pain, neck pain and neck stiffness.  Skin: Negative for rash.  Neurological: Negative for headaches.  All other systems reviewed and are negative.    Physical  Exam Updated Vital Signs BP (!) 126/92 (BP Location: Left Arm)   Pulse 98   Temp 98.6 F (37 C) (Oral)   Resp 20   Ht 5\' 1"  (1.549 m)   Wt 99.3 kg (219 lb)   LMP 02/22/2017 (Exact Date)   SpO2 98%   BMI 41.38 kg/m   Physical Exam  Constitutional: She is oriented to person, place, and time. She appears well-developed and well-nourished. No distress.  HENT:  Head: Normocephalic and atraumatic.  Right Ear: External ear normal. Tympanic membrane is not injected, not perforated, not erythematous and not retracted.  Left Ear: External ear normal. Tympanic membrane is not injected, not perforated, not erythematous and not retracted.  Mouth/Throat: No oropharyngeal exudate.    Eyes: Pupils are equal, round, and reactive to light. Conjunctivae are normal.  Neck: Normal range of motion. Neck supple. No JVD present. No tracheal deviation present.  Cardiovascular: Normal rate, regular rhythm and intact distal pulses.   Pulmonary/Chest: Effort normal and breath sounds normal. No stridor. She has no wheezes. She has no rales.  Abdominal: Soft. Bowel sounds are normal. She exhibits no mass. There is no tenderness. There is no rebound and no guarding.  Musculoskeletal: Normal range of motion.  Neurological: She is alert and oriented to person, place, and time.  Skin: Skin is warm and dry. Capillary refill takes less than 2 seconds.  Psychiatric: She has a normal mood and affect.     ED Treatments / Results   Vitals:   02/26/17 2345 02/26/17 2354  BP: (!) 126/92   Pulse: 98   Resp: 20   Temp: 98.6 F (37 C)   SpO2: 95% 98%    Radiology No results found.  Procedures Procedures (including critical care time)  Medications Ordered in ED Medications  ondansetron (ZOFRAN-ODT) disintegrating tablet 8 mg (not administered)  benzonatate (TESSALON) capsule 200 mg (not administered)      Final Clinical Impressions(s) / ED Diagnoses   Final diagnoses:  Viral upper respiratory tract infection    Strict return precautions given for  Shortness of breath, swelling or the lips or tongue, chest pain, dyspnea on exertion, new weakness or numbness changes in vision or speech,  Inability to tolerate liquids or food, changes in voice cough, altered mental status or any concerns. No signs of systemic illness or infection. The patient is nontoxic-appearing on exam and vital signs are within normal limits.    I have reviewed the triage vital signs and the nursing notes. Pertinent labs &imaging results that were available during my care of the patient were reviewed by me and considered in my medical decision making (see chart for details).  After history, exam, and  medical workup I feel the patient has been appropriately medically screened and is safe for discharge home. Pertinent diagnoses were discussed with the patient. Patient was given return precautions.   New Prescriptions New Prescriptions   BENZONATATE (TESSALON) 100 MG CAPSULE    Take 1 capsule (100 mg total) by mouth every 8 (eight) hours.   FLUTICASONE (FLONASE) 50 MCG/ACT NASAL SPRAY    Place 2 sprays into both nostrils daily.   LORATADINE (CLARITIN) 10 MG TABLET    Take 1 tablet (10 mg total) by mouth daily.     Ajla Mcgeachy, MD 02/27/17 6269

## 2017-02-27 NOTE — ED Notes (Signed)
Bed: WA22 Expected date:  Expected time:  Means of arrival:  Comments: 

## 2021-10-25 ENCOUNTER — Encounter (HOSPITAL_COMMUNITY): Payer: Self-pay

## 2021-10-25 ENCOUNTER — Other Ambulatory Visit: Payer: Self-pay

## 2021-10-25 ENCOUNTER — Observation Stay (HOSPITAL_COMMUNITY)
Admission: EM | Admit: 2021-10-25 | Discharge: 2021-10-26 | Disposition: A | Discharge status: Home / Self Care | Payer: Self-pay | Attending: Internal Medicine | Admitting: Internal Medicine

## 2021-10-25 ENCOUNTER — Emergency Department (HOSPITAL_COMMUNITY): Payer: Self-pay

## 2021-10-25 DIAGNOSIS — E669 Obesity, unspecified: Secondary | ICD-10-CM

## 2021-10-25 DIAGNOSIS — J45909 Unspecified asthma, uncomplicated: Secondary | ICD-10-CM | POA: Diagnosis present

## 2021-10-25 DIAGNOSIS — R1031 Right lower quadrant pain: Secondary | ICD-10-CM

## 2021-10-25 DIAGNOSIS — E66813 Obesity, class 3: Secondary | ICD-10-CM

## 2021-10-25 DIAGNOSIS — D509 Iron deficiency anemia, unspecified: Principal | ICD-10-CM | POA: Insufficient documentation

## 2021-10-25 DIAGNOSIS — Z79899 Other long term (current) drug therapy: Secondary | ICD-10-CM | POA: Insufficient documentation

## 2021-10-25 DIAGNOSIS — Z720 Tobacco use: Secondary | ICD-10-CM

## 2021-10-25 DIAGNOSIS — D259 Leiomyoma of uterus, unspecified: Secondary | ICD-10-CM | POA: Diagnosis present

## 2021-10-25 DIAGNOSIS — F17210 Nicotine dependence, cigarettes, uncomplicated: Secondary | ICD-10-CM | POA: Insufficient documentation

## 2021-10-25 DIAGNOSIS — D649 Anemia, unspecified: Secondary | ICD-10-CM | POA: Diagnosis present

## 2021-10-25 HISTORY — DX: Tobacco use: Z72.0

## 2021-10-25 HISTORY — DX: Obesity, unspecified: E66.9

## 2021-10-25 LAB — URINALYSIS, ROUTINE W REFLEX MICROSCOPIC
Bacteria, UA: NONE SEEN
Bilirubin Urine: NEGATIVE
Glucose, UA: NEGATIVE mg/dL
Ketones, ur: NEGATIVE mg/dL
Leukocytes,Ua: NEGATIVE
Nitrite: NEGATIVE
Protein, ur: NEGATIVE mg/dL
Specific Gravity, Urine: 1.044 — ABNORMAL HIGH (ref 1.005–1.030)
pH: 5 (ref 5.0–8.0)

## 2021-10-25 LAB — CBC WITH DIFFERENTIAL/PLATELET
Abs Immature Granulocytes: 0.02 10*3/uL (ref 0.00–0.07)
Basophils Absolute: 0 10*3/uL (ref 0.0–0.1)
Basophils Relative: 1 %
Eosinophils Absolute: 0.1 10*3/uL (ref 0.0–0.5)
Eosinophils Relative: 1 %
HCT: 20.3 % — ABNORMAL LOW (ref 36.0–46.0)
Hemoglobin: 5 g/dL — CL (ref 12.0–15.0)
Immature Granulocytes: 0 %
Lymphocytes Relative: 39 %
Lymphs Abs: 2 10*3/uL (ref 0.7–4.0)
MCH: 13.4 pg — ABNORMAL LOW (ref 26.0–34.0)
MCHC: 24.6 g/dL — ABNORMAL LOW (ref 30.0–36.0)
MCV: 54.6 fL — ABNORMAL LOW (ref 80.0–100.0)
Monocytes Absolute: 0.4 10*3/uL (ref 0.1–1.0)
Monocytes Relative: 7 %
Neutro Abs: 2.7 10*3/uL (ref 1.7–7.7)
Neutrophils Relative %: 52 %
Platelets: 497 10*3/uL — ABNORMAL HIGH (ref 150–400)
RBC: 3.72 MIL/uL — ABNORMAL LOW (ref 3.87–5.11)
RDW: 22.6 % — ABNORMAL HIGH (ref 11.5–15.5)
WBC: 5.1 10*3/uL (ref 4.0–10.5)
nRBC: 0 % (ref 0.0–0.2)

## 2021-10-25 LAB — LIPASE, BLOOD: Lipase: 24 U/L (ref 11–51)

## 2021-10-25 LAB — COMPREHENSIVE METABOLIC PANEL WITH GFR
ALT: 12 U/L (ref 0–44)
AST: 13 U/L — ABNORMAL LOW (ref 15–41)
Albumin: 4.1 g/dL (ref 3.5–5.0)
Alkaline Phosphatase: 68 U/L (ref 38–126)
Anion gap: 5 (ref 5–15)
BUN: 8 mg/dL (ref 6–20)
CO2: 24 mmol/L (ref 22–32)
Calcium: 8.8 mg/dL — ABNORMAL LOW (ref 8.9–10.3)
Chloride: 111 mmol/L (ref 98–111)
Creatinine, Ser: 0.47 mg/dL (ref 0.44–1.00)
GFR, Estimated: >60 mL/min
Glucose, Bld: 91 mg/dL (ref 70–99)
Potassium: 4 mmol/L (ref 3.5–5.1)
Sodium: 140 mmol/L (ref 135–145)
Total Bilirubin: 0.9 mg/dL (ref 0.3–1.2)
Total Protein: 7.5 g/dL (ref 6.5–8.1)

## 2021-10-25 LAB — I-STAT BETA HCG BLOOD, ED (MC, WL, AP ONLY): I-stat hCG, quantitative: <5 m[IU]/mL (ref <5)

## 2021-10-25 MED ORDER — CHLORHEXIDINE GLUCONATE CLOTH 2 % EX PADS
6.0000 | MEDICATED_PAD | Freq: Every day | CUTANEOUS | Status: DC
  Administered 2021-10-26: 6 via TOPICAL

## 2021-10-25 MED ORDER — IOHEXOL 300 MG/ML  SOLN
100.0000 mL | Freq: Once | INTRAMUSCULAR | Status: AC | PRN
  Administered 2021-10-25: 100 mL via INTRAVENOUS

## 2021-10-25 MED ORDER — ONDANSETRON HCL 4 MG/2ML IJ SOLN
4.0000 mg | Freq: Once | INTRAMUSCULAR | Status: AC
  Administered 2021-10-25: 4 mg via INTRAVENOUS
  Filled 2021-10-25: qty 2

## 2021-10-25 MED ORDER — SODIUM CHLORIDE 0.9 % IV SOLN
10.0000 mL/h | Freq: Once | INTRAVENOUS | Status: AC
  Administered 2021-10-26: 10 mL/h via INTRAVENOUS

## 2021-10-25 MED ORDER — SODIUM CHLORIDE 0.9 % IV BOLUS
1000.0000 mL | Freq: Once | INTRAVENOUS | Status: AC
Start: 2021-10-25 — End: 2021-10-25
  Administered 2021-10-25: 1000 mL via INTRAVENOUS

## 2021-10-25 NOTE — Assessment & Plan Note (Signed)
Asymptomatic and controlled.

## 2021-10-25 NOTE — Assessment & Plan Note (Signed)
BMI of 39 

## 2021-10-25 NOTE — ED Notes (Signed)
ED TO INPATIENT HANDOFF REPORT  ED Nurse Name and Phone #: Luz Brazen RN [phone]  S Name/Age/Gender Cheryl Crane 36 y.o. female Room/Bed: WA25/WA25  Code Status   Code Status: Full Code  Home/SNF/Other Home Patient oriented to: self, place, time, and situation Is this baseline? Yes   Triage Complete: Triage complete  Chief Complaint Symptomatic anemia [D64.9]  Triage Note Patient c/o RLQ pain and nausea x 2 days.   Allergies No Known Allergies  Level of Care/Admitting Diagnosis ED Disposition     ED Disposition  Admit   Condition  --   Comment  Hospital Area: [facility] [[id number]]  Level of Care: Stepdown [14]  Admit to SDU based on following criteria: Cardiac Instability:  Patients experiencing chest pain, unconfirmed MI and stable, arrhythmias and CHF requiring medical management and potentially compromising patient's stability  May place patient in observation at [facility] or [facility] if equivalent level of care is available:: No  Covid Evaluation: Asymptomatic - no recent exposure (last 10 days) testing not required  Diagnosis: Symptomatic anemia [6073710]  Admitting Physician: Orene Desanctis [[id number]]  Attending Physician: Orene Desanctis [[id number]]          B Medical/Surgery History Past Medical History:  Diagnosis Date   Asthma    Bronchitis    History reviewed. No pertinent surgical history.   A IV Location/Drains/Wounds Patient Lines/Drains/Airways Status     Active Line/Drains/Airways     Name Placement date Placement time Site Days   Peripheral IV 10/25/21 20 G Right Antecubital 10/25/21  1956  Antecubital  less than 1            Intake/Output Last 24 hours No intake or output data in the 24 hours ending 10/25/21 2342  Labs/Imaging Results for orders placed or performed during the hospital encounter of 10/25/21 (from the past 48 hour(s))  Comprehensive metabolic panel     Status: Abnormal   Collection Time:  10/25/21  8:00 PM  Result Value Ref Range   Sodium 140 135 - 145 mmol/L   Potassium 4.0 3.5 - 5.1 mmol/L   Chloride 111 98 - 111 mmol/L   CO2 24 22 - 32 mmol/L   Glucose, Bld 91 70 - 99 mg/dL    Comment: Glucose reference range applies only to samples taken after fasting for at least 8 hours.   BUN 8 6 - 20 mg/dL   Creatinine, Ser 0.47 0.44 - 1.00 mg/dL   Calcium 8.8 (L) 8.9 - 10.3 mg/dL   Total Protein 7.5 6.5 - 8.1 g/dL   Albumin 4.1 3.5 - 5.0 g/dL   AST 13 (L) 15 - 41 U/L   ALT 12 0 - 44 U/L   Alkaline Phosphatase 68 38 - 126 U/L   Total Bilirubin 0.9 0.3 - 1.2 mg/dL   GFR, Estimated >60 >60 mL/min    Comment: (NOTE) Calculated using the CKD-EPI Creatinine Equation (2021)    Anion gap 5 5 - 15    Comment: Performed at [facility], [place] [address], [place], [place] [address]  Lipase, blood     Status: None   Collection Time: 10/25/21  8:00 PM  Result Value Ref Range   Lipase 24 11 - 51 U/L    Comment: Performed at [facility], [place] [address], [place], [place] [address]  CBC with Diff     Status: Abnormal   Collection Time: 10/25/21  8:00 PM  Result Value Ref  Range   WBC 5.1 4.0 - 10.5 K/uL   RBC 3.72 (L) 3.87 - 5.11 MIL/uL   Hemoglobin 5.0 (LL) 12.0 - 15.0 g/dL    Comment: Reticulocyte Hemoglobin testing may be clinically indicated, consider ordering this additional test [id number] THIS CRITICAL RESULT HAS VERIFIED AND BEEN CALLED TO Aundria Rud , RN BY MEGAN HAYES ON 05 21 2023 AT 2023, AND HAS BEEN READ BACK. CRITICAL RESULT VERIFIED    HCT 20.3 (L) 36.0 - 46.0 %   MCV 54.6 (L) 80.0 - 100.0 fL   MCH 13.4 (L) 26.0 - 34.0 pg   MCHC 24.6 (L) 30.0 - 36.0 g/dL   RDW 22.6 (H) 11.5 - 15.5 %   Platelets 497 (H) 150 - 400 K/uL   nRBC 0.0 0.0 - 0.2 %   Neutrophils Relative % 52 %   Neutro Abs 2.7 1.7 - 7.7 K/uL   Lymphocytes Relative 39 %   Lymphs Abs 2.0 0.7 - 4.0 K/uL   Monocytes Relative 7 %   Monocytes Absolute 0.4 0.1 - 1.0 K/uL    Eosinophils Relative 1 %   Eosinophils Absolute 0.1 0.0 - 0.5 K/uL   Basophils Relative 1 %   Basophils Absolute 0.0 0.0 - 0.1 K/uL   Immature Granulocytes 0 %   Abs Immature Granulocytes 0.02 0.00 - 0.07 K/uL   Tear Drop Cells PRESENT    Dimorphism PRESENT    Polychromasia PRESENT    Ovalocytes PRESENT    Giant PLTs PRESENT     Comment: Performed at [facility], [place] [address] [place], [place] [address]  I-Stat beta hCG blood, ED     Status: None   Collection Time: 10/25/21  8:12 PM  Result Value Ref Range   I-stat hCG, quantitative <5.0 <5 mIU/mL   Comment 3            Comment:   GEST. AGE      CONC.  (mIU/mL)   <=1 WEEK        5 - 50     2 WEEKS       50 - 500     3 WEEKS       100 - 10,000     4 WEEKS     1,000 - 30,000        FEMALE AND NON-PREGNANT FEMALE:     LESS THAN 5 mIU/mL   Urinalysis, Routine w reflex microscopic     Status: Abnormal   Collection Time: 10/25/21  9:54 PM  Result Value Ref Range   Color, Urine YELLOW YELLOW   APPearance CLEAR CLEAR   Specific Gravity, Urine 1.044 (H) 1.005 - 1.030   pH 5.0 5.0 - 8.0   Glucose, UA NEGATIVE NEGATIVE mg/dL   Hgb urine dipstick MODERATE (A) NEGATIVE   Bilirubin Urine NEGATIVE NEGATIVE   Ketones, ur NEGATIVE NEGATIVE mg/dL   Protein, ur NEGATIVE NEGATIVE mg/dL   Nitrite NEGATIVE NEGATIVE   Leukocytes,Ua NEGATIVE NEGATIVE   RBC / HPF 6-10 0 - 5 RBC/hpf   WBC, UA 0-5 0 - 5 WBC/hpf   Bacteria, UA NONE SEEN NONE SEEN   Squamous Epithelial / LPF 0-5 0 - 5   Mucus PRESENT     Comment: Performed at [facility], [place] [address], [place], [place] [address]   CT ABDOMEN PELVIS W CONTRAST  Result Date: 10/25/2021 CLINICAL DATA:  Right lower quadrant pain, nausea. History of fibroid uterus EXAM: CT ABDOMEN AND PELVIS WITH CONTRAST  TECHNIQUE: Multidetector CT imaging of the abdomen and pelvis was performed using the standard protocol following bolus administration of intravenous  contrast. RADIATION DOSE REDUCTION: This exam was performed according to the departmental dose-optimization program which includes automated exposure control, adjustment of the mA and/or kV according to patient size and/or use of iterative reconstruction technique. CONTRAST:  133m OMNIPAQUE IOHEXOL 300 MG/ML  SOLN COMPARISON:  None Available. FINDINGS: Lower chest: Included lung bases are clear.  Heart size is normal. Hepatobiliary: No focal liver abnormality is seen. No gallstones, gallbladder wall thickening, or biliary dilatation. Pancreas: Unremarkable. No pancreatic ductal dilatation or surrounding inflammatory changes. Spleen: Normal in size without focal abnormality. Adrenals/Urinary Tract: Unremarkable adrenal glands. Kidneys enhance symmetrically without focal lesion, stone, or hydronephrosis. Ureters are nondilated. Urinary bladder appears unremarkable. Stomach/Bowel: Stomach is within normal limits. Appendix appears normal (series 2, image 50). No evidence of bowel wall thickening, distention, or inflammatory changes. Vascular/Lymphatic: No significant vascular findings are present. No enlarged abdominal or pelvic lymph nodes. Reproductive: Markedly enlarged multifibroid uterus. The 2 largest fibroids at the uterine fundus each measure approximately 9 cm in diameter. Central hypodensity within multiple fibroids suggesting hyaline degeneration. No adnexal masses identified. Other: No free fluid. No abdominopelvic fluid collection. No pneumoperitoneum. No abdominal wall hernia. Musculoskeletal: No acute or significant osseous findings. IMPRESSION: 1. No acute abdominopelvic findings. Normal appendix. 2. Markedly enlarged multifibroid uterus. Electronically Signed   By: NDavina PokeD.O.   On: 10/25/2021 21:30    Pending Labs Unresulted Labs (From admission, onward)     Start     Ordered   10/26/21 0500  CBC  Tomorrow morning,   R        10/25/21 2256   10/25/21 2342  MRSA Next Gen by PCR, Nasal   Once,   R        10/25/21 2341   10/25/21 2256  HIV Antibody (routine testing w rflx)  (HIV Antibody (Routine testing w reflex) panel)  Once,   R        10/25/21 2256   10/25/21 2213  Prepare RBC (crossmatch)  (Adult Blood Administration - PRBC)  Once,   R       Question Answer Comment  # of Units 2 units   Transfusion Indications Red Blood Exchange   Number of Units to Keep Ahead NO units ahead   If emergent release call blood bank [phone]     10/25/21 2215            Vitals/Pain Today's Vitals   10/25/21 1838 10/25/21 1850 10/25/21 1930 10/25/21 2200  BP:  131/62 128/73 (!) 124/48  Pulse:   77 77  Resp:  18 13 (!) 21  Temp:      TempSrc:      SpO2:   99% 100%  Weight: 95.3 kg     Height: '5\' 1"'$  (1.549 m)     PainSc:        Isolation Precautions No active isolations  Medications Medications  0.9 %  sodium chloride infusion (has no administration in time range)  Chlorhexidine Gluconate Cloth 2 % PADS 6 each (has no administration in time range)  sodium chloride 0.9 % bolus 1,000 mL (1,000 mLs Intravenous Bolus 10/25/21 2007)  ondansetron (ZOFRAN) injection 4 mg (4 mg Intravenous Given 10/25/21 2006)  iohexol (OMNIPAQUE) 300 MG/ML solution 100 mL (100 mLs Intravenous Contrast Given 10/25/21 2103)    Mobility walks Low fall risk   Focused Assessments  R Recommendations: See Admitting Provider Note  Report given to:   Additional Notes:

## 2021-10-25 NOTE — ED Triage Notes (Signed)
Patient c/o RLQ pain and nausea x 2 days.

## 2021-10-25 NOTE — Assessment & Plan Note (Addendum)
Secondary to uterine fibroids and abnormal uterine bleeding Hgb of 5 on presentation with no prior for comparison.  Likely has chronic anemia at baseline due to her menorrhagia -CT abdomen revealing for markedly enlarged multifibroid uterus with 2 measuring 9 cm -Transfuse 2 unit PRBC and follow post H&H -Recommend follow-up with OB/GYN outpatient for discussion of options for fibroid management. Pt desires fertility.

## 2021-10-25 NOTE — Assessment & Plan Note (Signed)
Smokes up to a pack daily. Counseled on cessation.

## 2021-10-25 NOTE — H&P (Addendum)
History and Physical    Patient: Cheryl Crane:[id number] DOB: [birth year] DOA: 10/25/2021 DOS: the patient was seen and examined on 10/25/2021 PCP: Patient, No Pcp Per (Inactive)  Patient coming from: Home  Chief Complaint:  Chief Complaint  Patient presents with   Abdominal Pain   Nausea   HPI: Cheryl Crane is a 36 y.o. female with medical history significant of uterine fibroid, asthma, symptomatic anemia who presents with right-sided abdominal pain.  For the past 2 days she has noticed right lower abdominal pain today with new shooting pain radiating up to her epigastric region.  Has felt palpitations. She was significantly anemic in the ED with hemoglobin of 5 with no recent prior for comparison.  CT abdomen and pelvis showed markedly enlarged multi fibroid uterus with the 2 largest fibroids at the uterine fundus each measuring 9 cm in diameter.  She is currently on day one of her menstrual cycle.  Her cycle comes every 2 weeks lasting 10 to 12 days.  Reports using up to 7 thick overnight pads daily and tapers off towards the end of her cycle.  This has been ongoing for many years.  She was first diagnosed with uterine fibroid close to 10 years ago when she had a miscarriage.  She has followed up with an OB/GYN for many years as she felt her options in line with her wishes for child-bearing were not addressed.   She reports tobacco use up to a pack a day. Denies alcohol or illicit drug use. Review of Systems: As mentioned in the history of present illness. All other systems reviewed and are negative. Past Medical History:  Diagnosis Date   Asthma    Bronchitis    History reviewed. No pertinent surgical history. Social History:  reports that she has been smoking cigarettes. She has been smoking an average of .15 packs per day. She does not have any smokeless tobacco history on file. She reports that she does not drink alcohol and does not use drugs.  No Known  Allergies  Family History  Problem Relation Age of Onset   Diabetes Mother    Heart disease Father     Prior to Admission medications   Medication Sig Start Date End Date Taking? Authorizing Provider  benzonatate (TESSALON) 100 MG capsule Take 1 capsule (100 mg total) by mouth every 8 (eight) hours. 02/27/17   Palumbo, April, MD  ferrous sulfate (FEOSOL) 325 (65 FE) MG tablet Take 1 tablet (325 mg total) by mouth 2 (two) times daily with a meal. 06/08/15   Bonnielee Haff, MD  fluticasone [facility]) 50 MCG/ACT nasal spray Place 2 sprays into both nostrils daily. 02/27/17   Palumbo, April, MD  ibuprofen (ADVIL,MOTRIN) 200 MG tablet Take 400-600 mg by mouth every 8 (eight) hours as needed (for pain).    [provider]  Ibuprofen-Diphenhydramine HCl (ADVIL PM) 200-25 MG CAPS Take 2 tablets by mouth at bedtime as needed (for sleep).    [provider]  loratadine (CLARITIN) 10 MG tablet Take 1 tablet (10 mg total) by mouth daily. 02/27/17   Palumbo, April, MD    Physical Exam: Vitals:   10/25/21 1838 10/25/21 1850 10/25/21 1930 10/25/21 2200  BP:  131/62 128/73 (!) 124/48  Pulse:   77 77  Resp:  18 13 (!) 21  Temp:      TempSrc:      SpO2:   99% 100%  Weight: 95.3 kg     Height: '5\' 1"'$  (1.549  m)      Constitutional: NAD, calm, comfortable, fatigued appearing morbidly obese female sitting upright in bed Eyes: lids and conjunctivae normal ENMT: Mucous membranes are moist with pallor of her lips Neck: normal, supple Respiratory: clear to auscultation bilaterally, no wheezing, no crackles. Normal respiratory effort.   Cardiovascular: Regular rate and rhythm, no murmurs / rubs / gallops. No extremity edema.   Abdomen: Soft, nondistended no tenderness, no masses palpated.  Bowel sounds positive.  Musculoskeletal: no clubbing / cyanosis. No joint deformity upper and lower extremities. Good ROM, no contractures. Normal muscle tone.  Skin: no rashes, lesions, ulcers. No  induration Neurologic: CN 2-12 grossly intact. Sensation intact, Strength 5/5 in all 4.  Psychiatric: Normal judgment and insight. Alert and oriented x 3. Normal mood. Data Reviewed:  See HPI  Assessment and Plan: * Symptomatic anemia Secondary to uterine fibroids and abnormal uterine bleeding Hgb of 5 on presentation with no prior for comparison.  Likely has chronic anemia at baseline due to her menorrhagia -CT abdomen revealing for markedly enlarged multifibroid uterus with 2 measuring 9 cm -Transfuse 2 unit PRBC and follow post H&H -Recommend follow-up with OB/GYN outpatient for discussion of options for fibroid management. Pt desires fertility.    Obesity (BMI 30-39.9) BMI of 39  Tobacco use Smokes up to a pack daily. Counseled on cessation.  Asthma Asymptomatic and controlled.      Advance Care Planning:   Code Status: Full Code   Consults: none  Family Communication: Mother briefly at bedside but left before discussion of plans  Severity of Illness: The appropriate patient status for this patient is OBSERVATION. Observation status is judged to be reasonable and necessary in order to provide the required intensity of service to ensure the patient's safety. The patient's presenting symptoms, physical exam findings, and initial radiographic and laboratory data in the context of their medical condition is felt to place them at decreased risk for further clinical deterioration. Furthermore, it is anticipated that the patient will be medically stable for discharge from the hospital within 2 midnights of admission.   Author: Orene Desanctis, DO 10/25/2021 11:12 PM  For on call review [email or web address].

## 2021-10-25 NOTE — ED Notes (Signed)
Pt made aware urine sample is needed.  

## 2021-10-25 NOTE — ED Provider Notes (Signed)
[facility] DEPT Provider Note   CSN: [id number] Arrival date & time: 10/25/21  1824     History  Chief Complaint  Patient presents with   Abdominal Pain   Nausea    Cheryl Crane is a 36 y.o. female.  Patient with no pertinent past medical history presents today with complaints of right lower quadrant abdominal pain.  She states that same came on suddenly Friday evening and has been worsening since onset.  She endorses some mild intermittent associated nausea without vomiting or diarrhea.  She states that her last bowel movement was this morning and was normal for her.  She also states that her menstrual cycle started today, however she states that this pain is different from her normal menstrual cramping.  She does endorse a history of uterine fibroids and wonders if her symptoms could be due to this.  She denies any fevers, chills, hematuria, dysuria, hematochezia, melena, vaginal pain, or vaginal discharge.  She denies any history of abdominal surgeries.     Abdominal Pain Associated symptoms: nausea   Associated symptoms: no chills, no diarrhea, no dysuria, no fever and no vomiting       Home Medications Prior to Admission medications   Medication Sig Start Date End Date Taking? Authorizing Provider  benzonatate (TESSALON) 100 MG capsule Take 1 capsule (100 mg total) by mouth every 8 (eight) hours. 02/27/17   Palumbo, April, MD  ferrous sulfate (FEOSOL) 325 (65 FE) MG tablet Take 1 tablet (325 mg total) by mouth 2 (two) times daily with a meal. 06/08/15   Bonnielee Haff, MD  fluticasone [facility]) 50 MCG/ACT nasal spray Place 2 sprays into both nostrils daily. 02/27/17   Palumbo, April, MD  ibuprofen (ADVIL,MOTRIN) 200 MG tablet Take 400-600 mg by mouth every 8 (eight) hours as needed (for pain).    [provider]  Ibuprofen-Diphenhydramine HCl (ADVIL PM) 200-25 MG CAPS Take 2 tablets by mouth at bedtime as needed (for sleep).    [provider]  loratadine (CLARITIN) 10 MG tablet Take 1 tablet (10 mg total) by mouth daily. 02/27/17   Palumbo, April, MD      Allergies    Patient has no known allergies.    Review of Systems   Review of Systems  Constitutional:  Negative for chills and fever.  Gastrointestinal:  Positive for abdominal pain and nausea. Negative for diarrhea and vomiting.  Genitourinary:  Negative for dysuria.  All other systems reviewed and are negative.  Physical Exam Updated Vital Signs BP 131/62   Pulse 85   Temp 98.5 F (36.9 C) (Oral)   Resp 18   Ht '5\' 1"'$  (1.549 m)   Wt 95.3 kg   LMP 10/25/2021   SpO2 98%   BMI 39.68 kg/m  Physical Exam Vitals and nursing note reviewed.  Constitutional:      General: She is not in acute distress.    Appearance: Normal appearance. She is well-developed. She is obese. She is not ill-appearing, toxic-appearing or diaphoretic.  HENT:     Head: Normocephalic and atraumatic.  Cardiovascular:     Rate and Rhythm: Normal rate and regular rhythm.     Heart sounds: Normal heart sounds.  Pulmonary:     Effort: Pulmonary effort is normal. No respiratory distress.     Breath sounds: Normal breath sounds.  Abdominal:     General: Abdomen is flat.     Palpations: Abdomen is soft.     Tenderness: There is  abdominal tenderness in the right lower quadrant.  Musculoskeletal:        General: Normal range of motion.     Cervical back: Normal range of motion.  Skin:    General: Skin is warm and dry.  Neurological:     General: No focal deficit present.     Mental Status: She is alert.  Psychiatric:        Mood and Affect: Mood normal.        Behavior: Behavior normal.    ED Results / Procedures / Treatments   Labs (all labs ordered are listed, but only abnormal results are displayed) Labs Reviewed  COMPREHENSIVE METABOLIC PANEL - Abnormal; Notable for the following components:      Result Value   Calcium 8.8 (*)    AST 13 (*)    All other components  within normal limits  CBC WITH DIFFERENTIAL/PLATELET - Abnormal; Notable for the following components:   RBC 3.72 (*)    Hemoglobin 5.0 (*)    HCT 20.3 (*)    MCV 54.6 (*)    MCH 13.4 (*)    MCHC 24.6 (*)    RDW 22.6 (*)    Platelets 497 (*)    All other components within normal limits  LIPASE, BLOOD  URINALYSIS, ROUTINE W REFLEX MICROSCOPIC  I-STAT BETA HCG BLOOD, ED ([facility], [facility], [facility] ONLY)  PREPARE RBC (CROSSMATCH)    EKG None  Radiology CT ABDOMEN PELVIS W CONTRAST  Result Date: 10/25/2021 CLINICAL DATA:  Right lower quadrant pain, nausea. History of fibroid uterus EXAM: CT ABDOMEN AND PELVIS WITH CONTRAST TECHNIQUE: Multidetector CT imaging of the abdomen and pelvis was performed using the standard protocol following bolus administration of intravenous contrast. RADIATION DOSE REDUCTION: This exam was performed according to the departmental dose-optimization program which includes automated exposure control, adjustment of the mA and/or kV according to patient size and/or use of iterative reconstruction technique. CONTRAST:  160m OMNIPAQUE IOHEXOL 300 MG/ML  SOLN COMPARISON:  None Available. FINDINGS: Lower chest: Included lung bases are clear.  Heart size is normal. Hepatobiliary: No focal liver abnormality is seen. No gallstones, gallbladder wall thickening, or biliary dilatation. Pancreas: Unremarkable. No pancreatic ductal dilatation or surrounding inflammatory changes. Spleen: Normal in size without focal abnormality. Adrenals/Urinary Tract: Unremarkable adrenal glands. Kidneys enhance symmetrically without focal lesion, stone, or hydronephrosis. Ureters are nondilated. Urinary bladder appears unremarkable. Stomach/Bowel: Stomach is within normal limits. Appendix appears normal (series 2, image 50). No evidence of bowel wall thickening, distention, or inflammatory changes. Vascular/Lymphatic: No significant vascular findings are present. No enlarged abdominal or pelvic lymph nodes.  Reproductive: Markedly enlarged multifibroid uterus. The 2 largest fibroids at the uterine fundus each measure approximately 9 cm in diameter. Central hypodensity within multiple fibroids suggesting hyaline degeneration. No adnexal masses identified. Other: No free fluid. No abdominopelvic fluid collection. No pneumoperitoneum. No abdominal wall hernia. Musculoskeletal: No acute or significant osseous findings. IMPRESSION: 1. No acute abdominopelvic findings. Normal appendix. 2. Markedly enlarged multifibroid uterus. Electronically Signed   By: NDavina PokeD.O.   On: 10/25/2021 21:30    Procedures .Critical Care Performed by: SBud Face PA-C Authorized by: SBud Face PA-C   Critical care provider statement:    Critical care time (minutes):  35   Critical care start time:  10/25/2021 8:26 PM   Critical care end time:  10/25/2021 9:01 PM   Critical care time was exclusive of:  Separately billable procedures and treating other patients   Critical care  was necessary to treat or prevent imminent or life-threatening deterioration of the following conditions:  Circulatory failure   Critical care was time spent personally by me on the following activities:  Development of treatment plan with patient or surrogate, discussions with consultants, evaluation of patient's response to treatment, examination of patient, obtaining history from patient or surrogate, ordering and review of laboratory studies, ordering and review of radiographic studies, pulse oximetry, re-evaluation of patient's condition and review of old charts   I assumed direction of critical care for this patient from another provider in my specialty: no     Care discussed with: admitting provider      Medications Ordered in ED Medications  0.9 %  sodium chloride infusion (has no administration in time range)  sodium chloride 0.9 % bolus 1,000 mL (1,000 mLs Intravenous Bolus 10/25/21 2007)  ondansetron (ZOFRAN) injection 4 mg (4  mg Intravenous Given 10/25/21 2006)  iohexol (OMNIPAQUE) 300 MG/ML solution 100 mL (100 mLs Intravenous Contrast Given 10/25/21 2103)    ED Course/ Medical Decision Making/ A&P                           Medical Decision Making Amount and/or Complexity of Data Reviewed Labs: ordered. Radiology: ordered.  Risk Prescription drug management.   This patient presents to the ED for concern of RLQ abdominal pain, this involves an extensive number of treatment options, and is a complaint that carries with it a high risk of complications and morbidity.   Co morbidities that complicate the patient evaluation  Hx uterine fibroids   Additional history obtained:  Additional history obtained from previous ER visit when patient presented with symptomatic anemia requiring transfusion in the past   Lab Tests:  I Ordered, and personally interpreted labs.  The pertinent results include:  Hemoglobin 5.0. No other acute laboratory findings   Imaging Studies ordered:  I ordered imaging studies including Ct abdomen pelvis with contrast  I independently visualized and interpreted imaging which showed  1. No acute abdominopelvic findings. Normal appendix. 2. Markedly enlarged multifibroid uterus. I agree with the radiologist interpretation   Cardiac Monitoring: / EKG:  The patient was maintained on a cardiac monitor.  I personally viewed and interpreted the cardiac monitored which showed an underlying rhythm of: sinus rhythm. No STEMI   Problem List / ED Course / Critical interventions / Medication management  I ordered medication including zofran and fluids  for nausea and dehydration, pRBCs for anemia  Reevaluation of the patient after these medicines showed that the patient improved I have reviewed the patients home medicines and have made adjustments as needed   Test / Admission - Considered:  Patient presents today with complaints of right lower quadrant abdominal pain. She is  afebrile, nontoxic-appearing, and in no acute distress with reassuring vital signs. Work-up from an abdominal pain standpoint is benign, however her hemoglobin is 5. Upon further discussion with her, she states that she has had a history of symptomatic anemia several years ago requiring transfusion. At this time her anemia was attributed to her large fibroid uterus. She states that she normally has very heavy menstrual cycles every month that last 10-12 days during which she goes through approximately 7 large overnight pads daily. She states that she started her menstrual cycle today and it is not heavy at this time but is normally very heavy on day 2. She denies any hematochezia or melena. Given active bleeding with current anemia,  will seek admission for same. Patient has been updated and all questions answered. She is amenable with blood transfusion. 2 units ordered. Patient is understanding and amenable with plan.  Discussed patient with hospitalist who agrees to admit.   Final Clinical Impression(s) / ED Diagnoses Final diagnoses:  Symptomatic anemia  Right lower quadrant abdominal pain    Rx / DC Orders ED Discharge Orders     None         Nestor Lewandowsky 10/25/21 2233    Daleen Bo, MD 10/27/21 1215

## 2021-10-26 ENCOUNTER — Encounter (HOSPITAL_COMMUNITY): Payer: Self-pay

## 2021-10-26 LAB — HIV ANTIBODY (ROUTINE TESTING W REFLEX): HIV Screen 4th Generation wRfx: NONREACTIVE

## 2021-10-26 LAB — CBC WITH DIFFERENTIAL/PLATELET
Abs Immature Granulocytes: 0.01 10*3/uL (ref 0.00–0.07)
Basophils Absolute: 0 10*3/uL (ref 0.0–0.1)
Basophils Relative: 1 %
Eosinophils Absolute: 0.1 10*3/uL (ref 0.0–0.5)
Eosinophils Relative: 1 %
HCT: 27 % — ABNORMAL LOW (ref 36.0–46.0)
Hemoglobin: 7.1 g/dL — ABNORMAL LOW (ref 12.0–15.0)
Immature Granulocytes: 0 %
Lymphocytes Relative: 30 %
Lymphs Abs: 1.3 10*3/uL (ref 0.7–4.0)
MCH: 16.2 pg — ABNORMAL LOW (ref 26.0–34.0)
MCHC: 26.3 g/dL — ABNORMAL LOW (ref 30.0–36.0)
MCV: 61.5 fL — ABNORMAL LOW (ref 80.0–100.0)
Monocytes Absolute: 0.5 10*3/uL (ref 0.1–1.0)
Monocytes Relative: 11 %
Neutro Abs: 2.5 10*3/uL (ref 1.7–7.7)
Neutrophils Relative %: 57 %
Platelets: 436 10*3/uL — ABNORMAL HIGH (ref 150–400)
RBC: 4.39 MIL/uL (ref 3.87–5.11)
RDW: 27.9 % — ABNORMAL HIGH (ref 11.5–15.5)
WBC: 4.3 10*3/uL (ref 4.0–10.5)
nRBC: 0.5 % — ABNORMAL HIGH (ref 0.0–0.2)

## 2021-10-26 LAB — PREPARE RBC (CROSSMATCH)

## 2021-10-26 LAB — MRSA NEXT GEN BY PCR, NASAL: MRSA by PCR Next Gen: NOT DETECTED

## 2021-10-26 LAB — TSH: TSH: 4.691 u[IU]/mL — ABNORMAL HIGH (ref 0.350–4.500)

## 2021-10-26 LAB — HEMOGLOBIN AND HEMATOCRIT, BLOOD
HCT: 29.9 % — ABNORMAL LOW (ref 36.0–46.0)
Hemoglobin: 8.5 g/dL — ABNORMAL LOW (ref 12.0–15.0)

## 2021-10-26 LAB — VITAMIN B12: Vitamin B-12: 366 pg/mL (ref 180–914)

## 2021-10-26 MED ORDER — ENSURE ENLIVE PO LIQD: 237.0000 mL | Freq: Two times a day (BID) | ORAL | Status: DC

## 2021-10-26 MED ORDER — DOCUSATE SODIUM 100 MG PO CAPS: 100.0000 mg | ORAL_CAPSULE | Freq: Two times a day (BID) | ORAL | 0 refills | Status: DC

## 2021-10-26 MED ORDER — SODIUM CHLORIDE 0.9% IV SOLUTION: Freq: Once | INTRAVENOUS | Status: DC

## 2021-10-26 MED ORDER — METOPROLOL TARTRATE 5 MG/5ML IV SOLN: 5.0000 mg | INTRAVENOUS | Status: DC | PRN

## 2021-10-26 MED ORDER — SODIUM CHLORIDE 0.9 % IV SOLN
510.0000 mg | Freq: Once | INTRAVENOUS | Status: AC
  Administered 2021-10-26: 510 mg via INTRAVENOUS
  Filled 2021-10-26: qty 17

## 2021-10-26 MED ORDER — SENNOSIDES-DOCUSATE SODIUM 8.6-50 MG PO TABS: 1.0000 | ORAL_TABLET | Freq: Every evening | ORAL | Status: DC | PRN

## 2021-10-26 MED ORDER — OXYCODONE HCL 5 MG PO TABS: 5.0000 mg | ORAL_TABLET | ORAL | Status: DC | PRN

## 2021-10-26 MED ORDER — GUAIFENESIN 100 MG/5ML PO LIQD
5.0000 mL | ORAL | Status: DC | PRN
Start: 2021-10-26 — End: 2021-10-27

## 2021-10-26 MED ORDER — FERROUS SULFATE 325 (65 FE) MG PO TABS: 325.0000 mg | ORAL_TABLET | Freq: Two times a day (BID) | ORAL | Status: DC

## 2021-10-26 MED ORDER — HYDRALAZINE HCL 20 MG/ML IJ SOLN
10.0000 mg | INTRAMUSCULAR | Status: DC | PRN
Start: 2021-10-26 — End: 2021-10-27

## 2021-10-26 MED ORDER — ACETAMINOPHEN 325 MG PO TABS
650.0000 mg | ORAL_TABLET | Freq: Four times a day (QID) | ORAL | Status: DC | PRN
  Administered 2021-10-26: 650 mg via ORAL
  Filled 2021-10-26: qty 2

## 2021-10-26 MED ORDER — SENNOSIDES-DOCUSATE SODIUM 8.6-50 MG PO TABS: 1.0000 | ORAL_TABLET | Freq: Every evening | ORAL | 0 refills | Status: DC | PRN

## 2021-10-26 MED ORDER — IPRATROPIUM-ALBUTEROL 0.5-2.5 (3) MG/3ML IN SOLN: 3.0000 mL | RESPIRATORY_TRACT | Status: DC | PRN

## 2021-10-26 MED ORDER — FERROUS SULFATE 325 (65 FE) MG PO TABS: 325.0000 mg | ORAL_TABLET | Freq: Two times a day (BID) | ORAL | 0 refills | Status: DC

## 2021-10-26 MED ORDER — ACETAMINOPHEN 325 MG PO TABS
650.0000 mg | ORAL_TABLET | Freq: Once | ORAL | Status: AC
  Administered 2021-10-26: 650 mg via ORAL
  Filled 2021-10-26: qty 2

## 2021-10-26 MED ORDER — MEGESTROL ACETATE 40 MG PO TABS: 40.0000 mg | ORAL_TABLET | Freq: Two times a day (BID) | ORAL | 1 refills | Status: DC

## 2021-10-26 MED ORDER — DOCUSATE SODIUM 100 MG PO CAPS
100.0000 mg | ORAL_CAPSULE | Freq: Two times a day (BID) | ORAL | Status: DC
  Administered 2021-10-26: 100 mg via ORAL
  Filled 2021-10-26: qty 1

## 2021-10-26 NOTE — Discharge Summary (Signed)
Physician Discharge Summary  Cheryl Crane YSA:[id number] DOB: [birth year] DOA: 10/25/2021  PCP: Patient, No Pcp Per (Inactive)  Admit date: 10/25/2021 Discharge date: 10/26/2021  Admitted From: Home Disposition:  Home  Recommendations for Outpatient Follow-up:  Follow up with PCP in 1-2 weeks Please obtain BMP/CBC in one week your next doctors visit.  Megace 40 mg twice daily X 30 days with 1 refill with recommendations from Dr. Ilda Basset from GYN and outpatient follow-up with their service to be arranged by their service. Iron supplements with bowel regimen  Discharge Condition: Stable CODE STATUS: Full Code Diet recommendation: Regular   Brief/Interim Summary: 36 y.o. female with medical history significant of uterine fibroid, asthma, symptomatic anemia who presents with right-sided abdominal pain. She was found to have Hb of 5.0, severe iron def anemia likely from uterine fibroids and menorrhagia.  During the hospitalization patient received about 3 units of PRBC which improved her hemoglobin and she symptomatically felt much better.  She had severe iron deficiency anemia requiring IV iron during hospitalization and p.o. supplements upon discharge with bowel regimen. Discussed case with Dr. Ilda Basset from GYN who recommended Megace 40 mg twice daily for 30 days with 1 refill and outpatient follow-up with their service. Patient can be discharged today after her 3rd unit of PRBC transfusion     Assessment & Plan:  Principal Problem:   Symptomatic anemia Active Problems:   Fibroid uterus   Asthma   Tobacco use   Obesity (BMI 30-39.9)       Assessment and Plan: * Symptomatic anemia; Iron Def Secondary to uterine fibroids and abnormal uterine bleeding Hgb of 5 on presentation; baseline 7.9. 2U PRBC tx ordered. Repeat CBC -CT abdomen revealing for markedly enlarged multifibroid uterus with 2 measuring 9 cm -Spoke with GYN, Dr Ilda Basset - will arrange for outptn f/u.  And discharged on  Megace 40 mg twice daily; 30 day supply with 1 refill. Ideally patient would require hysterectomy but she would still like to have children therefore further options will be discussed by the patient with GYN.     Obesity (BMI 30-39.9) BMI of 39   Tobacco use Smokes up to a pack daily. Counseled on cessation.   Asthma Asymptomatic and controlled     Body mass index is 41.28 kg/m.       Discharge Diagnoses:  Principal Problem:   Symptomatic anemia Active Problems:   Fibroid uterus   Asthma   Tobacco use   Obesity (BMI 30-39.9)      Consultations: Spoke with Dr. Ilda Basset from GYN  Subjective: Patient states symptomatically she feels much better today.  Still has some menstrual bleeding. She understands her follow-up recommendations.  Discharge Exam: Vitals:   10/26/21 1157 10/26/21 1200  BP: 122/76 125/63  Pulse: 69 68  Resp: (!) 21 (!) 22  Temp: 98.1 F (36.7 C)   SpO2: 100% 99%   Vitals:   10/26/21 0742 10/26/21 1127 10/26/21 1157 10/26/21 1200  BP: 114/66 123/60 122/76 125/63  Pulse: 65 65 69 68  Resp: 15 (!) 21 (!) 21 (!) 22  Temp: 98.6 F (37 C) 98 F (36.7 C) 98.1 F (36.7 C)   TempSrc: Oral Oral Axillary   SpO2: 100% 99% 100% 99%  Weight:      Height:        General: Pt is alert, awake, not in acute distress Cardiovascular: RRR, S1/S2 +, no rubs, no gallops Respiratory: CTA bilaterally, no wheezing, no rhonchi Abdominal: Soft, NT, ND, bowel sounds +  Extremities: no edema, no cyanosis  Discharge Instructions   Allergies as of 10/26/2021   No Known Allergies      Medication List     TAKE these medications    acetaminophen 500 MG tablet Commonly known as: TYLENOL Take 1,000 mg by mouth every 6 (six) hours as needed for mild pain.   docusate sodium 100 MG capsule Commonly known as: COLACE Take 1 capsule (100 mg total) by mouth 2 (two) times daily. Notes to patient: 10/26/21 9:00pm   ferrous sulfate 325 (65 FE) MG tablet Take  1 tablet (325 mg total) by mouth 2 (two) times daily with a meal. Start taking on: Oct 27, 2021 Notes to patient: 10/27/21   Ibuprofen-diphenhydrAMINE HCl 200-25 MG Caps Take 2 tablets by mouth at bedtime as needed (for sleep).   megestrol 40 MG tablet Commonly known as: MEGACE Take 1 tablet (40 mg total) by mouth 2 (two) times daily. Notes to patient: 10/27/21    multivitamin with minerals tablet Take 1 tablet by mouth daily. Notes to patient: 10/27/21   senna-docusate 8.6-50 MG tablet Commonly known as: Senokot-S Take 1 tablet by mouth at bedtime as needed for moderate constipation.        Follow-up Information     Aletha Halim, MD Follow up in 3 week(s).   Specialty: Obstetrics and Gynecology Contact information: [place] [address] [phone]                No Known Allergies  You were cared for by a hospitalist during your hospital stay. If you have any questions about your discharge medications or the care you received while you were in the hospital after you are discharged, you can call the unit and asked to speak with the hospitalist on call if the hospitalist that took care of you is not available. Once you are discharged, your primary care physician will handle any further medical issues. Please note that no refills for any discharge medications will be authorized once you are discharged, as it is imperative that you return to your primary care physician (or establish a relationship with a primary care physician if you do not have one) for your aftercare needs so that they can reassess your need for medications and monitor your lab values.   Procedures/Studies: CT ABDOMEN PELVIS W CONTRAST  Result Date: 10/25/2021 CLINICAL DATA:  Right lower quadrant pain, nausea. History of fibroid uterus EXAM: CT ABDOMEN AND PELVIS WITH CONTRAST TECHNIQUE: Multidetector CT imaging of the abdomen and pelvis was performed using the standard  protocol following bolus administration of intravenous contrast. RADIATION DOSE REDUCTION: This exam was performed according to the departmental dose-optimization program which includes automated exposure control, adjustment of the mA and/or kV according to patient size and/or use of iterative reconstruction technique. CONTRAST:  181m OMNIPAQUE IOHEXOL 300 MG/ML  SOLN COMPARISON:  None Available. FINDINGS: Lower chest: Included lung bases are clear.  Heart size is normal. Hepatobiliary: No focal liver abnormality is seen. No gallstones, gallbladder wall thickening, or biliary dilatation. Pancreas: Unremarkable. No pancreatic ductal dilatation or surrounding inflammatory changes. Spleen: Normal in size without focal abnormality. Adrenals/Urinary Tract: Unremarkable adrenal glands. Kidneys enhance symmetrically without focal lesion, stone, or hydronephrosis. Ureters are nondilated. Urinary bladder appears unremarkable. Stomach/Bowel: Stomach is within normal limits. Appendix appears normal (series 2, image 50). No evidence of bowel wall thickening, distention, or inflammatory changes. Vascular/Lymphatic: No significant vascular findings are present. No enlarged abdominal or pelvic lymph nodes. Reproductive:  Markedly enlarged multifibroid uterus. The 2 largest fibroids at the uterine fundus each measure approximately 9 cm in diameter. Central hypodensity within multiple fibroids suggesting hyaline degeneration. No adnexal masses identified. Other: No free fluid. No abdominopelvic fluid collection. No pneumoperitoneum. No abdominal wall hernia. Musculoskeletal: No acute or significant osseous findings. IMPRESSION: 1. No acute abdominopelvic findings. Normal appendix. 2. Markedly enlarged multifibroid uterus. Electronically Signed   By: Davina Poke D.O.   On: 10/25/2021 21:30     The results of significant diagnostics from this hospitalization (including imaging, microbiology, ancillary and laboratory) are  listed below for reference.     Microbiology: Recent Results (from the past 240 hour(s))  MRSA Next Gen by PCR, Nasal     Status: None   Collection Time: 10/26/21 12:27 AM   Specimen: Nasal Mucosa; Nasal Swab  Result Value Ref Range Status   MRSA by PCR Next Gen NOT DETECTED NOT DETECTED Final    Comment: (NOTE) The GeneXpert MRSA Assay (FDA approved for NASAL specimens only), is one component of a comprehensive MRSA colonization surveillance program. It is not intended to diagnose MRSA infection nor to guide or monitor treatment for MRSA infections. Test performance is not FDA approved in patients less than 37 years old. Performed at [facility], [place] [address], [place], [place] [address]      Labs: BNP (last 3 results) No results for input(s): BNP in the last 8760 hours. Basic Metabolic Panel: Recent Labs  Lab 10/25/21 2000  NA 140  K 4.0  CL 111  CO2 24  GLUCOSE 91  BUN 8  CREATININE 0.47  CALCIUM 8.8*   Liver Function Tests: Recent Labs  Lab 10/25/21 2000  AST 13*  ALT 12  ALKPHOS 68  BILITOT 0.9  PROT 7.5  ALBUMIN 4.1   Recent Labs  Lab 10/25/21 2000  LIPASE 24   No results for input(s): AMMONIA in the last 168 hours. CBC: Recent Labs  Lab 10/25/21 2000 10/26/21 0949  WBC 5.1 4.3  NEUTROABS 2.7 2.5  HGB 5.0* 7.1*  HCT 20.3* 27.0*  MCV 54.6* 61.5*  PLT 497* 436*   Cardiac Enzymes: No results for input(s): CKTOTAL, CKMB, CKMBINDEX, TROPONINI in the last 168 hours. BNP: Invalid input(s): POCBNP CBG: No results for input(s): GLUCAP in the last 168 hours. D-Dimer No results for input(s): DDIMER in the last 72 hours. Hgb A1c No results for input(s): HGBA1C in the last 72 hours. Lipid Profile No results for input(s): CHOL, HDL, LDLCALC, TRIG, CHOLHDL, LDLDIRECT in the last 72 hours. Thyroid function studies Recent Labs    10/26/21 0949  TSH 4.691*   Anemia work up Recent Labs    10/26/21 0949  VITAMINB12 366    Urinalysis    Component Value Date/Time   COLORURINE YELLOW 10/25/2021 2154   APPEARANCEUR CLEAR 10/25/2021 2154   LABSPEC 1.044 (H) 10/25/2021 2154   PHURINE 5.0 10/25/2021 2154   GLUCOSEU NEGATIVE 10/25/2021 2154   HGBUR MODERATE (A) 10/25/2021 2154   BILIRUBINUR NEGATIVE 10/25/2021 2154   KETONESUR NEGATIVE 10/25/2021 2154   PROTEINUR NEGATIVE 10/25/2021 2154   UROBILINOGEN 0.2 08/16/2012 1030   NITRITE NEGATIVE 10/25/2021 2154   LEUKOCYTESUR NEGATIVE 10/25/2021 2154   Sepsis Labs Invalid input(s): PROCALCITONIN,  WBC,  LACTICIDVEN Microbiology Recent Results (from the past 240 hour(s))  MRSA Next Gen by PCR, Nasal     Status: None   Collection Time: 10/26/21 12:27 AM   Specimen: Nasal Mucosa; Nasal Swab  Result Value Ref  Range Status   MRSA by PCR Next Gen NOT DETECTED NOT DETECTED Final    Comment: (NOTE) The GeneXpert MRSA Assay (FDA approved for NASAL specimens only), is one component of a comprehensive MRSA colonization surveillance program. It is not intended to diagnose MRSA infection nor to guide or monitor treatment for MRSA infections. Test performance is not FDA approved in patients less than 28 years old. Performed at [facility], [place] [address] [place], [place] [address]      Time coordinating discharge:  I have spent 35 minutes face to face with the patient and on the ward discussing the patients care, assessment, plan and disposition with other care givers. >50% of the time was devoted counseling the patient about the risks and benefits of treatment/Discharge disposition and coordinating care.   SIGNED:   Damita Lack, MD  [facility] 10/26/2021, 12:15 PM   If 7PM-7AM, please contact night-coverage

## 2021-10-27 LAB — TYPE AND SCREEN
ABO/RH(D): A POS
Antibody Screen: NEGATIVE
Unit division: 0
Unit division: 0
Unit division: 0

## 2021-10-27 LAB — BPAM RBC
Blood Product Expiration Date: 202306142359
Blood Product Expiration Date: 202306162359
Blood Product Expiration Date: 202306162359
ISSUE DATE / TIME: 202305220150
ISSUE DATE / TIME: 202305220529
ISSUE DATE / TIME: 202305221132
Unit Type and Rh: 6200
Unit Type and Rh: 6200
Unit Type and Rh: 6200

## 2021-12-10 ENCOUNTER — Encounter: Payer: Self-pay | Admitting: Family Medicine

## 2021-12-10 ENCOUNTER — Ambulatory Visit (INDEPENDENT_AMBULATORY_CARE_PROVIDER_SITE_OTHER): Payer: Self-pay | Admitting: Family Medicine

## 2021-12-10 VITALS — BP 127/79 | HR 87 | Wt 225.7 lb

## 2021-12-10 DIAGNOSIS — N920 Excessive and frequent menstruation with regular cycle: Secondary | ICD-10-CM | POA: Insufficient documentation

## 2021-12-10 DIAGNOSIS — D649 Anemia, unspecified: Secondary | ICD-10-CM

## 2021-12-10 DIAGNOSIS — D259 Leiomyoma of uterus, unspecified: Secondary | ICD-10-CM

## 2021-12-10 HISTORY — DX: Excessive and frequent menstruation with regular cycle: N92.0

## 2021-12-10 MED ORDER — NORETHINDRONE ACETATE 5 MG PO TABS: 15.0000 mg | ORAL_TABLET | Freq: Every day | ORAL | 1 refills | Status: DC

## 2021-12-10 NOTE — Assessment & Plan Note (Signed)
Po iron

## 2021-12-10 NOTE — Assessment & Plan Note (Signed)
Due to fibroids--may respond better to Aygestin. Has gained approx 15 pounds on Megace and has not had success with stopping bleeding.

## 2021-12-10 NOTE — Assessment & Plan Note (Signed)
Multiple and large--undecided about wanting kids. She might want a hysterectomy. No insurance. To apply to charity care. [facility] referral for pap. Needs EMB. Given application and DepoLupron app. If needs any surgery, would benefit from shrinking of uterine size.

## 2021-12-10 NOTE — Progress Notes (Signed)
   Subjective:    Patient ID: Cheryl Crane is a 36 y.o. female presenting with Follow-up  on 12/10/2021  HPI: Taking Megace 40 mg bid and still having a lot of bleeding x 5-7 years. Has a lot of pain with cycles. Passing clots.  Comes monthly and lasts 8-10 days. Recent admission and transfusion. Has long h/o fibroid uterus. Still strongly desires kids. Does not usually have cycles that stay on so long,but since hospitalization and megace, her bleeding has not stopped.   Review of Systems  Constitutional:  Negative for chills and fever.  Respiratory:  Negative for shortness of breath.   Cardiovascular:  Negative for chest pain.  Gastrointestinal:  Negative for abdominal pain, nausea and vomiting.  Genitourinary:  Negative for dysuria.  Skin:  Negative for rash.      Objective:    BP 127/79   Pulse 87   Wt 225 lb 11.2 oz (102.4 kg)   BMI 42.65 kg/m  Physical Exam Exam conducted with a chaperone present.  Constitutional:      General: She is not in acute distress.    Appearance: She is well-developed.  HENT:     Head: Normocephalic and atraumatic.  Eyes:     General: No scleral icterus. Cardiovascular:     Rate and Rhythm: Normal rate.  Pulmonary:     Effort: Pulmonary effort is normal.  Abdominal:     Palpations: Abdomen is soft. There is mass.     Comments: 30 week large, firm uterus  Musculoskeletal:     Cervical back: Neck supple.  Skin:    General: Skin is warm and dry.  Neurological:     Mental Status: She is alert and oriented to person, place, and time.   CT - shows markedly large fibroids      Assessment & Plan:   Problem List Items Addressed This Visit       Unprioritized   Fibroid uterus - Primary    Multiple and large--undecided about wanting kids. She might want a hysterectomy. No insurance. To apply to charity care. [facility] referral for pap. Needs EMB. Given application and DepoLupron app. If needs any surgery, would benefit from shrinking of  uterine size.      Symptomatic anemia    Po iron      Menorrhagia with regular cycle    Due to fibroids--may respond better to Aygestin. Has gained approx 15 pounds on Megace and has not had success with stopping bleeding.      Relevant Medications   norethindrone (AYGESTIN) 5 MG tablet     Return in about 4 weeks (around 01/07/2022) for repeat pap and endometrial biopsy.  Cheryl Jude, MD 12/10/2021 10:10 AM

## 2022-01-28 ENCOUNTER — Telehealth: Payer: Self-pay

## 2022-01-28 ENCOUNTER — Ambulatory Visit: Payer: Self-pay | Admitting: Family Medicine

## 2022-01-28 ENCOUNTER — Encounter: Payer: Self-pay | Admitting: Family Medicine

## 2022-01-28 NOTE — Telephone Encounter (Signed)
Called pt to follow up on missed visit this AM. Pt states she spoke with someone in the office to reschedule. States maybe this was while the office was closed for lunch. Explained this was likely our answering service. I will have the front office call patient to reschedule. Pt agreeable.

## 2022-01-28 NOTE — Progress Notes (Signed)
Patient did not keep appointment today. Stated she called to reschedule.

## 2022-02-12 ENCOUNTER — Emergency Department (HOSPITAL_COMMUNITY)
Admission: EM | Admit: 2022-02-12 | Discharge: 2022-02-13 | Disposition: A | Discharge status: Home / Self Care | Payer: Self-pay | Attending: Emergency Medicine | Admitting: Emergency Medicine

## 2022-02-12 ENCOUNTER — Encounter (HOSPITAL_COMMUNITY): Payer: Self-pay | Admitting: Emergency Medicine

## 2022-02-12 DIAGNOSIS — D259 Leiomyoma of uterus, unspecified: Secondary | ICD-10-CM | POA: Insufficient documentation

## 2022-02-12 DIAGNOSIS — R1084 Generalized abdominal pain: Secondary | ICD-10-CM

## 2022-02-12 DIAGNOSIS — N9489 Other specified conditions associated with female genital organs and menstrual cycle: Secondary | ICD-10-CM | POA: Insufficient documentation

## 2022-02-12 DIAGNOSIS — J45909 Unspecified asthma, uncomplicated: Secondary | ICD-10-CM | POA: Insufficient documentation

## 2022-02-12 LAB — CBC
HCT: 31.2 % — ABNORMAL LOW (ref 36.0–46.0)
Hemoglobin: 9.1 g/dL — ABNORMAL LOW (ref 12.0–15.0)
MCH: 20 pg — ABNORMAL LOW (ref 26.0–34.0)
MCHC: 29.2 g/dL — ABNORMAL LOW (ref 30.0–36.0)
MCV: 68.4 fL — ABNORMAL LOW (ref 80.0–100.0)
Platelets: 582 10*3/uL — ABNORMAL HIGH (ref 150–400)
RBC: 4.56 MIL/uL (ref 3.87–5.11)
RDW: 21.7 % — ABNORMAL HIGH (ref 11.5–15.5)
WBC: 11.6 10*3/uL — ABNORMAL HIGH (ref 4.0–10.5)
nRBC: 0 % (ref 0.0–0.2)

## 2022-02-12 LAB — COMPREHENSIVE METABOLIC PANEL
ALT: 27 U/L (ref 0–44)
AST: 21 U/L (ref 15–41)
Albumin: 4.4 g/dL (ref 3.5–5.0)
Alkaline Phosphatase: 104 U/L (ref 38–126)
Anion gap: 9 (ref 5–15)
BUN: 8 mg/dL (ref 6–20)
CO2: 22 mmol/L (ref 22–32)
Calcium: 9.3 mg/dL (ref 8.9–10.3)
Chloride: 106 mmol/L (ref 98–111)
Creatinine, Ser: 0.63 mg/dL (ref 0.44–1.00)
GFR, Estimated: >60 mL/min (ref >60)
Glucose, Bld: 129 mg/dL — ABNORMAL HIGH (ref 70–99)
Potassium: 3.8 mmol/L (ref 3.5–5.1)
Sodium: 137 mmol/L (ref 135–145)
Total Bilirubin: 1 mg/dL (ref 0.3–1.2)
Total Protein: 9 g/dL — ABNORMAL HIGH (ref 6.5–8.1)

## 2022-02-12 LAB — I-STAT BETA HCG BLOOD, ED (MC, WL, AP ONLY): I-stat hCG, quantitative: <5 m[IU]/mL (ref <5)

## 2022-02-12 LAB — LIPASE, BLOOD: Lipase: 29 U/L (ref 11–51)

## 2022-02-12 NOTE — ED Triage Notes (Signed)
Patient c/o lower abdominal pain x3 days with vomiting today. Last BM yesterday.

## 2022-02-13 ENCOUNTER — Encounter (HOSPITAL_COMMUNITY): Payer: Self-pay | Admitting: Student

## 2022-02-13 ENCOUNTER — Emergency Department (HOSPITAL_COMMUNITY): Payer: Self-pay

## 2022-02-13 LAB — WET PREP, GENITAL
Clue Cells Wet Prep HPF POC: NONE SEEN
Sperm: NONE SEEN
Trich, Wet Prep: NONE SEEN
WBC, Wet Prep HPF POC: <10 (ref <10)
Yeast Wet Prep HPF POC: NONE SEEN

## 2022-02-13 MED ORDER — KETOROLAC TROMETHAMINE 15 MG/ML IJ SOLN
15.0000 mg | Freq: Once | INTRAMUSCULAR | Status: AC
  Administered 2022-02-13: 15 mg via INTRAVENOUS
  Filled 2022-02-13: qty 1

## 2022-02-13 MED ORDER — ONDANSETRON HCL 4 MG/2ML IJ SOLN
4.0000 mg | Freq: Once | INTRAMUSCULAR | Status: AC
  Administered 2022-02-13: 4 mg via INTRAVENOUS
  Filled 2022-02-13: qty 2

## 2022-02-13 MED ORDER — SODIUM CHLORIDE 0.9 % IV BOLUS
1000.0000 mL | Freq: Once | INTRAVENOUS | Status: AC
  Administered 2022-02-13: 1000 mL via INTRAVENOUS

## 2022-02-13 MED ORDER — IOHEXOL 300 MG/ML  SOLN
100.0000 mL | Freq: Once | INTRAMUSCULAR | Status: AC | PRN
  Administered 2022-02-13: 100 mL via INTRAVENOUS

## 2022-02-13 MED ORDER — NAPROXEN 500 MG PO TABS: 500.0000 mg | ORAL_TABLET | Freq: Two times a day (BID) | ORAL | 0 refills | Status: DC | PRN

## 2022-02-13 MED ORDER — MORPHINE SULFATE (PF) 4 MG/ML IV SOLN
4.0000 mg | Freq: Once | INTRAVENOUS | Status: AC
  Administered 2022-02-13: 4 mg via INTRAVENOUS
  Filled 2022-02-13: qty 1

## 2022-02-13 MED ORDER — ONDANSETRON 4 MG PO TBDP: 4.0000 mg | ORAL_TABLET | Freq: Three times a day (TID) | ORAL | 0 refills | Status: DC | PRN

## 2022-02-13 NOTE — ED Provider Notes (Signed)
[facility] DEPT Provider Note   CSN: [id number] Arrival date & time: 02/12/22  2134     History  Chief Complaint  Patient presents with   Abdominal Pain    Cheryl Crane is a 36 y.o. female with a hx of asthma, uterine fibroids, menorrhagia, and symptomatic anemia who presents to the ED with complaints of abdominal pain x 2 days. Patient reports pain is generalized but more so to the lower abdomen around to her back. Constant, no alleviating factors. Associated nausea and vaginal bleeding started 2 days prior. Last period was sometime in June, she is currently on Norethindrone. She thinks her pain is fibroid related. Denies fever, vomiting, diarrhea, dysuria, or syncope. Not sexually active and not concerned for STDs.   HPI     Home Medications Prior to Admission medications   Medication Sig Start Date End Date Taking? Authorizing Provider  acetaminophen (TYLENOL) 500 MG tablet Take 1,000 mg by mouth every 6 (six) hours as needed for mild pain.    [provider]  docusate sodium (COLACE) 100 MG capsule Take 1 capsule (100 mg total) by mouth 2 (two) times daily. Patient not taking: Reported on 12/10/2021 10/26/21   Damita Lack, MD  ferrous sulfate 325 (65 FE) MG tablet Take 1 tablet (325 mg total) by mouth 2 (two) times daily with a meal. 10/27/21 12/10/21  Amin, Jeanella Flattery, MD  Ibuprofen-diphenhydrAMINE HCl 200-25 MG CAPS Take 2 tablets by mouth at bedtime as needed (for sleep).    [provider]  Multiple Vitamins-Minerals (MULTIVITAMIN WITH MINERALS) tablet Take 1 tablet by mouth daily.    [provider]  norethindrone (AYGESTIN) 5 MG tablet Take 3 tablets (15 mg total) by mouth daily. 12/10/21   Donnamae Jude, MD  senna-docusate (SENOKOT-S) 8.6-50 MG tablet Take 1 tablet by mouth at bedtime as needed for moderate constipation. Patient not taking: Reported on 12/10/2021 10/26/21   Damita Lack, MD      Allergies     Patient has no known allergies.    Review of Systems   Review of Systems  Constitutional:  Negative for chills and fever.  Respiratory:  Negative for shortness of breath.   Cardiovascular:  Negative for chest pain.  Gastrointestinal:  Positive for abdominal pain and nausea. Negative for diarrhea and vomiting.  Genitourinary:  Positive for vaginal bleeding. Negative for dysuria and vaginal discharge.  Neurological:  Negative for syncope.  All other systems reviewed and are negative.   Physical Exam Updated Vital Signs BP (!) 140/83   Pulse 99   Temp 98.6 F (37 C) (Oral)   Resp 18   LMP 02/12/2022   SpO2 99%  Physical Exam Vitals and nursing note reviewed.  Constitutional:      General: She is not in acute distress.    Appearance: She is well-developed. She is not toxic-appearing.  HENT:     Head: Normocephalic and atraumatic.  Eyes:     General:        Right eye: No discharge.        Left eye: No discharge.     Conjunctiva/sclera: Conjunctivae normal.  Cardiovascular:     Rate and Rhythm: Regular rhythm. Tachycardia present.  Pulmonary:     Effort: No respiratory distress.     Breath sounds: Normal breath sounds. No wheezing or rales.  Abdominal:     General: There is no distension.     Palpations: Abdomen is soft.  Tenderness: There is generalized abdominal tenderness.  Genitourinary:    Comments: Chaperone present.  Blood present in vagina, few clots. No significant hemorrhage.  Diffuse tenderness on bimanual exam.  Musculoskeletal:     Cervical back: Neck supple.  Skin:    General: Skin is warm and dry.  Neurological:     Mental Status: She is alert.     Comments: Clear speech.   Psychiatric:        Behavior: Behavior normal.     ED Results / Procedures / Treatments   Labs (all labs ordered are listed, but only abnormal results are displayed) Labs Reviewed  COMPREHENSIVE METABOLIC PANEL - Abnormal; Notable for the following components:       Result Value   Glucose, Bld 129 (*)    Total Protein 9.0 (*)    All other components within normal limits  CBC - Abnormal; Notable for the following components:   WBC 11.6 (*)    Hemoglobin 9.1 (*)    HCT 31.2 (*)    MCV 68.4 (*)    MCH 20.0 (*)    MCHC 29.2 (*)    RDW 21.7 (*)    Platelets 582 (*)    All other components within normal limits  LIPASE, BLOOD  URINALYSIS, ROUTINE W REFLEX MICROSCOPIC  I-STAT BETA HCG BLOOD, ED ([facility], [facility], [facility] ONLY)    EKG None  Radiology No results found.  Procedures Procedures    Medications Ordered in ED Medications  sodium chloride 0.9 % bolus 1,000 mL (1,000 mLs Intravenous New Bag/Given 02/13/22 0404)  ondansetron (ZOFRAN) injection 4 mg (4 mg Intravenous Given 02/13/22 0404)  ketorolac (TORADOL) 15 MG/ML injection 15 mg (15 mg Intravenous Given 02/13/22 0403)  morphine (PF) 4 MG/ML injection 4 mg (4 mg Intravenous Given 02/13/22 0504)  sodium chloride 0.9 % bolus 1,000 mL (1,000 mLs Intravenous New Bag/Given 02/13/22 0548)  iohexol (OMNIPAQUE) 300 MG/ML solution 100 mL (100 mLs Intravenous Contrast Given 02/13/22 0510)    ED Course/ Medical Decision Making/ A&P                           Medical Decision Making Amount and/or Complexity of Data Reviewed Labs: ordered. Radiology: ordered.  Risk Prescription drug management.  Patient presents to the ED with complaints of abdominal pain, this involves an extensive number of treatment options, and is a complaint that carries with it a high risk of complications and morbidity. Nontoxic, vitals w/ tachycardia & HTN. On exam generalized TTP, more so in the upper abdomen than the lower, does have diffuse tenderness on bimanual exam.  Ddx: Menstrual cramps, fibroids, ovarian cyst, ovarian torsion, diverticulitis, appendicitis, perforation, obstruction, viral GI illness, ectopic pregnancy, PID.  Additional history obtained:  Chart/nursing notes reviewed  Lab Tests:  I viewed & interpreted labs  including:  CBC: mild anemia similar to prior. Mildly elevated WBC & Plt.  CMP: fairly unremarkable, somewhat elevated protein level.  Lipase: WNL Preg test: Negative Wet prep: unremarkable.   I ordered zofran, toradol and fluids for symptomatic management, patient remains very uncomfortable, she states this feels much worse than any fibroid pain she has ever had. CT A/P ordered given patient more with diffuse pain as opposed to lower/pelvic, additionally ordered morphine & additional fluids.   Imaging Studies:  I ordered and viewed the following imaging, agree with radiologist impression:  CT A/P: IMPRESSION: 1. No acute findings in the abdomen or pelvis. 2. Markedly enlarged lobular  uterus compatible with fibroids.   ED Course:  Patient with markedly enlarged lobular uterus compatible with fibroids.  Her fibroids do project above the level of the umbilicus.  Given the size and location of her uterus/fibroids suspect this is likely the underlying etiology of patient's pain in the setting of her menstrual cycle.  She is feeling much better on reassessment, tolerating p.o., overall seems reasonable for discharge with follow-up with her gynecologist. I discussed results, treatment plan, need for follow-up, and return precautions with the patient. Provided opportunity for questions, patient confirmed understanding and is in agreement with plan.   Discussed w/ attending.   Portions of this note were generated with [employer]. Dictation errors may occur despite best attempts at proofreading.   Final Clinical Impression(s) / ED Diagnoses Final diagnoses:  Generalized abdominal pain  Uterine leiomyoma, unspecified location    Rx / DC Orders ED Discharge Orders          Ordered    naproxen (NAPROSYN) 500 MG tablet  2 times daily PRN        02/13/22 0630    ondansetron (ZOFRAN-ODT) 4 MG disintegrating tablet  Every 8 hours PRN        02/13/22 0630               Bela Bonaparte, Glynda Jaeger, PA-C 02/13/22 0631    Ripley Fraise, MD 02/13/22 [phone]

## 2022-02-13 NOTE — ED Provider Triage Note (Signed)
Emergency Medicine Provider Triage Evaluation Note  Cheryl Crane , a 36 y.o. female  was evaluated in triage.  Pt complains of lower abd pain. Hx of fibroids and anemia. Last CT was in may (normal except fibroids). States NV +  No CP or SOB   Review of Systems  Positive: Abd pain, NV Negative: Fever   Physical Exam  BP (!) 160/85   Pulse (!) 115   Temp 99.6 F (37.6 C)   Resp 18   LMP 02/12/2022   SpO2 99%  Gen:   Awake, no distress   Resp:  Normal effort  MSK:   Moves extremities without difficulty  Other:  Abd soft, obese, NTTP  Medical Decision Making  Medically screening exam initiated at 12:00 AM.  Appropriate orders placed.  Cheryl Crane was informed that the remainder of the evaluation will be completed by another provider, this initial triage assessment does not replace that evaluation, and the importance of remaining in the ED until their evaluation is complete.  [address]    Pati Gallo [place], [place] 02/13/22 0001

## 2022-02-13 NOTE — Discharge Instructions (Addendum)
You were seen in the emergency department today for abdominal pain.  Your CT scan showed very large fibroids.  Your labs showed mild anemia as well as an elevated white blood cell count and platelet count, please have these rechecked by your primary care provider.  We are sending you home with the following medicines to help with your symptoms:  - Naproxen- this is a nonsteroidal anti-inflammatory medication that will help with pain and swelling. Be sure to take this medication as prescribed with food, 1 pill every 12 hours,  It should be taken with food, as it can cause stomach upset, and more seriously, stomach bleeding. Do not take other nonsteroidal anti-inflammatory medications with this such as Advil, Motrin, Aleve, Mobic, Goodie Powder, or Motrin etc..    - Zofran- take every 8 hours as needed for nausea/vomiting.   You make take Tylenol per over the counter dosing with these medications.   We have prescribed you new medication(s) today. Discuss the medications prescribed today with your pharmacist as they can have adverse effects and interactions with your other medicines including over the counter and prescribed medications. Seek medical evaluation if you start to experience new or abnormal symptoms after taking one of these medicines, seek care immediately if you start to experience difficulty breathing, feeling of your throat closing, facial swelling, or rash as these could be indications of a more serious allergic reaction  Please call your gynecology office to schedule soonest available follow-up appointment.  Return to the ER for any new or worsening symptoms including but not limited to new or worsening pain, inability to keep fluids down, fever, passing out, or any other concerns.

## 2022-02-13 NOTE — ED Notes (Signed)
Pt reports 10/10 pain . Unable to sit pacing in room.

## 2022-02-15 LAB — GC/CHLAMYDIA PROBE AMP (~~LOC~~) NOT AT ARMC
Chlamydia: NEGATIVE
Comment: NEGATIVE
Comment: NORMAL
Neisseria Gonorrhea: NEGATIVE

## 2022-03-26 ENCOUNTER — Ambulatory Visit: Payer: Self-pay | Admitting: Family Medicine

## 2023-03-07 ENCOUNTER — Encounter (HOSPITAL_COMMUNITY): Payer: Self-pay

## 2023-03-07 ENCOUNTER — Inpatient Hospital Stay (HOSPITAL_COMMUNITY)
Admission: EM | Admit: 2023-03-07 | Discharge: 2023-03-09 | DRG: 760 | Disposition: A | Discharge status: Home / Self Care | Payer: Self-pay | Attending: Internal Medicine | Admitting: Internal Medicine

## 2023-03-07 ENCOUNTER — Inpatient Hospital Stay (HOSPITAL_COMMUNITY): Payer: Self-pay

## 2023-03-07 ENCOUNTER — Other Ambulatory Visit: Payer: Self-pay

## 2023-03-07 DIAGNOSIS — N920 Excessive and frequent menstruation with regular cycle: Principal | ICD-10-CM | POA: Diagnosis present

## 2023-03-07 DIAGNOSIS — E66813 Obesity, class 3: Secondary | ICD-10-CM | POA: Diagnosis present

## 2023-03-07 DIAGNOSIS — D75839 Thrombocytosis, unspecified: Secondary | ICD-10-CM | POA: Diagnosis present

## 2023-03-07 DIAGNOSIS — E669 Obesity, unspecified: Secondary | ICD-10-CM | POA: Diagnosis present

## 2023-03-07 DIAGNOSIS — Z8489 Family history of other specified conditions: Secondary | ICD-10-CM

## 2023-03-07 DIAGNOSIS — K5909 Other constipation: Secondary | ICD-10-CM | POA: Diagnosis present

## 2023-03-07 DIAGNOSIS — Z79899 Other long term (current) drug therapy: Secondary | ICD-10-CM

## 2023-03-07 DIAGNOSIS — Z803 Family history of malignant neoplasm of breast: Secondary | ICD-10-CM

## 2023-03-07 DIAGNOSIS — D509 Iron deficiency anemia, unspecified: Secondary | ICD-10-CM | POA: Diagnosis present

## 2023-03-07 DIAGNOSIS — E162 Hypoglycemia, unspecified: Secondary | ICD-10-CM | POA: Diagnosis present

## 2023-03-07 DIAGNOSIS — Z8249 Family history of ischemic heart disease and other diseases of the circulatory system: Secondary | ICD-10-CM

## 2023-03-07 DIAGNOSIS — Z6836 Body mass index (BMI) 36.0-36.9, adult: Secondary | ICD-10-CM

## 2023-03-07 DIAGNOSIS — Z87891 Personal history of nicotine dependence: Secondary | ICD-10-CM

## 2023-03-07 DIAGNOSIS — Z833 Family history of diabetes mellitus: Secondary | ICD-10-CM

## 2023-03-07 DIAGNOSIS — D649 Anemia, unspecified: Principal | ICD-10-CM | POA: Diagnosis present

## 2023-03-07 DIAGNOSIS — Z8742 Personal history of other diseases of the female genital tract: Secondary | ICD-10-CM

## 2023-03-07 DIAGNOSIS — J45909 Unspecified asthma, uncomplicated: Secondary | ICD-10-CM | POA: Diagnosis present

## 2023-03-07 DIAGNOSIS — N939 Abnormal uterine and vaginal bleeding, unspecified: Secondary | ICD-10-CM | POA: Insufficient documentation

## 2023-03-07 DIAGNOSIS — Z801 Family history of malignant neoplasm of trachea, bronchus and lung: Secondary | ICD-10-CM

## 2023-03-07 DIAGNOSIS — D259 Leiomyoma of uterus, unspecified: Secondary | ICD-10-CM | POA: Diagnosis present

## 2023-03-07 DIAGNOSIS — D62 Acute posthemorrhagic anemia: Secondary | ICD-10-CM | POA: Insufficient documentation

## 2023-03-07 LAB — BASIC METABOLIC PANEL
Anion gap: 7 (ref 5–15)
BUN: 5 mg/dL — ABNORMAL LOW (ref 6–20)
CO2: 22 mmol/L (ref 22–32)
Calcium: 8.3 mg/dL — ABNORMAL LOW (ref 8.9–10.3)
Chloride: 107 mmol/L (ref 98–111)
Creatinine, Ser: 0.53 mg/dL (ref 0.44–1.00)
GFR, Estimated: >60 mL/min (ref >60)
Glucose, Bld: 97 mg/dL (ref 70–99)
Potassium: 3.8 mmol/L (ref 3.5–5.1)
Sodium: 136 mmol/L (ref 135–145)

## 2023-03-07 LAB — CBC WITH DIFFERENTIAL/PLATELET
Abs Immature Granulocytes: 0.01 10*3/uL (ref 0.00–0.07)
Basophils Absolute: 0 10*3/uL (ref 0.0–0.1)
Basophils Relative: 0 %
Eosinophils Absolute: 0.1 10*3/uL (ref 0.0–0.5)
Eosinophils Relative: 1 %
HCT: 13 % — ABNORMAL LOW (ref 36.0–46.0)
Hemoglobin: 3 g/dL — CL (ref 12.0–15.0)
Immature Granulocytes: 0 %
Lymphocytes Relative: 38 %
Lymphs Abs: 2.2 10*3/uL (ref 0.7–4.0)
MCH: 12.6 pg — ABNORMAL LOW (ref 26.0–34.0)
MCHC: 23.1 g/dL — ABNORMAL LOW (ref 30.0–36.0)
MCV: 54.6 fL — ABNORMAL LOW (ref 80.0–100.0)
Monocytes Absolute: 0.5 10*3/uL (ref 0.1–1.0)
Monocytes Relative: 8 %
Neutro Abs: 3.1 10*3/uL (ref 1.7–7.7)
Neutrophils Relative %: 53 %
Platelets: 689 10*3/uL — ABNORMAL HIGH (ref 150–400)
RBC: 2.38 MIL/uL — ABNORMAL LOW (ref 3.87–5.11)
RDW: 22.6 % — ABNORMAL HIGH (ref 11.5–15.5)
WBC: 5.8 10*3/uL (ref 4.0–10.5)
nRBC: 0.5 % — ABNORMAL HIGH (ref 0.0–0.2)

## 2023-03-07 LAB — HEMOGLOBIN AND HEMATOCRIT, BLOOD
HCT: 13.8 % — ABNORMAL LOW (ref 36.0–46.0)
Hemoglobin: 3.1 g/dL — CL (ref 12.0–15.0)

## 2023-03-07 LAB — PREPARE RBC (CROSSMATCH)

## 2023-03-07 LAB — CBG MONITORING, ED: Glucose-Capillary: 92 mg/dL (ref 70–99)

## 2023-03-07 MED ORDER — SODIUM CHLORIDE 0.9% IV SOLUTION: Freq: Once | INTRAVENOUS | Status: AC

## 2023-03-07 MED ORDER — FERROUS SULFATE 325 (65 FE) MG PO TABS
325.0000 mg | ORAL_TABLET | Freq: Two times a day (BID) | ORAL | Status: DC
  Administered 2023-03-08: 325 mg via ORAL
  Filled 2023-03-07: qty 1

## 2023-03-07 MED ORDER — ONDANSETRON HCL 4 MG/2ML IJ SOLN
4.0000 mg | Freq: Four times a day (QID) | INTRAMUSCULAR | Status: DC | PRN
  Filled 2023-03-07: qty 2

## 2023-03-07 MED ORDER — ONDANSETRON HCL 4 MG PO TABS: 4.0000 mg | ORAL_TABLET | Freq: Four times a day (QID) | ORAL | Status: DC | PRN

## 2023-03-07 MED ORDER — SENNOSIDES-DOCUSATE SODIUM 8.6-50 MG PO TABS: 1.0000 | ORAL_TABLET | Freq: Every evening | ORAL | Status: DC | PRN

## 2023-03-07 MED ORDER — SODIUM CHLORIDE 0.9% FLUSH: 3.0000 mL | INTRAVENOUS | Status: DC | PRN

## 2023-03-07 MED ORDER — SODIUM CHLORIDE 0.9% FLUSH
3.0000 mL | Freq: Two times a day (BID) | INTRAVENOUS | Status: DC
  Administered 2023-03-08 – 2023-03-09 (×4): 3 mL via INTRAVENOUS

## 2023-03-07 MED ORDER — SODIUM CHLORIDE 0.9% IV SOLUTION: Freq: Once | INTRAVENOUS | Status: DC

## 2023-03-07 MED ORDER — DIPHENHYDRAMINE HCL 50 MG/ML IJ SOLN: 25.0000 mg | Freq: Four times a day (QID) | INTRAMUSCULAR | Status: DC | PRN

## 2023-03-07 MED ORDER — FUROSEMIDE 10 MG/ML IJ SOLN
20.0000 mg | Freq: Once | INTRAMUSCULAR | Status: AC
  Administered 2023-03-08: 20 mg via INTRAVENOUS
  Filled 2023-03-07: qty 2

## 2023-03-07 MED ORDER — ACETAMINOPHEN 500 MG PO TABS
1000.0000 mg | ORAL_TABLET | Freq: Four times a day (QID) | ORAL | Status: DC | PRN
  Administered 2023-03-07: 1000 mg via ORAL
  Filled 2023-03-07: qty 2

## 2023-03-07 MED ORDER — ADULT MULTIVITAMIN W/MINERALS CH
1.0000 | ORAL_TABLET | Freq: Every day | ORAL | Status: DC
  Administered 2023-03-08 – 2023-03-09 (×2): 1 via ORAL
  Filled 2023-03-07 (×2): qty 1

## 2023-03-07 MED ORDER — NORETHINDRONE ACETATE 5 MG PO TABS: 15.0000 mg | ORAL_TABLET | Freq: Every day | ORAL | Status: DC

## 2023-03-07 MED ORDER — ACETAMINOPHEN 650 MG RE SUPP: 650.0000 mg | Freq: Four times a day (QID) | RECTAL | Status: DC | PRN

## 2023-03-07 MED ORDER — SODIUM CHLORIDE 0.9 % IV SOLN: 250.0000 mL | INTRAVENOUS | Status: DC | PRN

## 2023-03-07 MED ORDER — DOCUSATE SODIUM 100 MG PO CAPS
100.0000 mg | ORAL_CAPSULE | Freq: Two times a day (BID) | ORAL | Status: DC
  Administered 2023-03-08 – 2023-03-09 (×2): 100 mg via ORAL
  Filled 2023-03-07 (×2): qty 1

## 2023-03-07 MED ORDER — ACETAMINOPHEN 325 MG PO TABS
650.0000 mg | ORAL_TABLET | Freq: Four times a day (QID) | ORAL | Status: DC | PRN
  Administered 2023-03-08 – 2023-03-09 (×2): 650 mg via ORAL
  Filled 2023-03-07 (×2): qty 2

## 2023-03-07 NOTE — ED Provider Notes (Signed)
[facility] EMERGENCY DEPARTMENT AT [facility] Provider Note   CSN: [id number] Arrival date & time: 03/07/23  1750     History  Chief Complaint  Patient presents with  . Hypoglycemia    Cheryl Crane is a 37 y.o. female.  Patient to ED with symptoms of lightheadedness, fatigue, DOE for the past 5 days. She reports history of fibroids in the past, causing anemia that required transfusion, last was last year. She reports she started bleeding vaginally on 9/20 and has been steadily bleeding since. No pain. No known syncopal episodes. No nausea, vomiting. No recent fever or illness.    The history is provided by the patient. No language interpreter was used.  Hypoglycemia      Home Medications Prior to Admission medications   Medication Sig Start Date End Date Taking? Authorizing Provider  acetaminophen (TYLENOL) 500 MG tablet Take 1,000 mg by mouth every 6 (six) hours as needed for mild pain.    [provider]  docusate sodium (COLACE) 100 MG capsule Take 1 capsule (100 mg total) by mouth 2 (two) times daily. Patient not taking: Reported on 12/10/2021 10/26/21   Miguel Rota, MD  ferrous sulfate 325 (65 FE) MG tablet Take 1 tablet (325 mg total) by mouth 2 (two) times daily with a meal. 10/27/21 12/10/21  Amin, Ankit C, MD  Ibuprofen-diphenhydrAMINE HCl 200-25 MG CAPS Take 2 tablets by mouth at bedtime as needed (for sleep).    [provider]  Multiple Vitamins-Minerals (MULTIVITAMIN WITH MINERALS) tablet Take 1 tablet by mouth daily.    [provider]  naproxen (NAPROSYN) 500 MG tablet Take 1 tablet (500 mg total) by mouth 2 (two) times daily as needed for moderate pain. 02/13/22   Petrucelli, Samantha R, PA-C  norethindrone (AYGESTIN) 5 MG tablet Take 3 tablets (15 mg total) by mouth daily. 12/10/21   Reva Bores, MD  ondansetron (ZOFRAN-ODT) 4 MG disintegrating tablet Take 1 tablet (4 mg total) by mouth every 8 (eight) hours as needed for  nausea or vomiting. 02/13/22   Petrucelli, Samantha R, PA-C  senna-docusate (SENOKOT-S) 8.6-50 MG tablet Take 1 tablet by mouth at bedtime as needed for moderate constipation. Patient not taking: Reported on 12/10/2021 10/26/21   Miguel Rota, MD      Allergies    Patient has no known allergies.    Review of Systems   Review of Systems  Physical Exam Updated Vital Signs BP 128/60   Pulse 88   Temp 99.4 F (37.4 C) (Oral)   Resp 16   Ht 5\' 1"  (1.549 m)   Wt 88.5 kg   SpO2 99%   BMI 36.84 kg/m  Physical Exam Vitals and nursing note reviewed.  HENT:     Head: Normocephalic.     Mouth/Throat:     Comments: Pale oral mucosa. Eyes:     Comments: Significant conjunctival pallor.   Cardiovascular:     Rate and Rhythm: Normal rate and regular rhythm.     Heart sounds: No murmur heard. Pulmonary:     Effort: Pulmonary effort is normal.     Breath sounds: No wheezing, rhonchi or rales.  Abdominal:     Tenderness: There is no abdominal tenderness.  Musculoskeletal:        General: Normal range of motion.     Cervical back: Normal range of motion and neck supple.  Skin:    General: Skin is warm and dry.  Neurological:  Mental Status: She is alert and oriented to person, place, and time.    ED Results / Procedures / Treatments   Labs (all labs ordered are listed, but only abnormal results are displayed) Labs Reviewed  BASIC METABOLIC PANEL - Abnormal; Notable for the following components:      Result Value   BUN 5 (*)    Calcium 8.3 (*)    All other components within normal limits  CBC WITH DIFFERENTIAL/PLATELET - Abnormal; Notable for the following components:   RBC 2.38 (*)    Hemoglobin 3.0 (*)    HCT 13.0 (*)    MCV 54.6 (*)    MCH 12.6 (*)    MCHC 23.1 (*)    RDW 22.6 (*)    Platelets 689 (*)    nRBC 0.5 (*)    All other components within normal limits  HEMOGLOBIN AND HEMATOCRIT, BLOOD - Abnormal; Notable for the following components:   Hemoglobin 3.1 (*)     HCT 13.8 (*)    All other components within normal limits  CBC WITH DIFFERENTIAL/PLATELET  CBG MONITORING, ED  CBG MONITORING, ED  PREPARE RBC (CROSSMATCH)  TYPE AND SCREEN    EKG EKG Interpretation Date/Time:  Monday March 07 2023 22:18:59 EDT Ventricular Rate:  88 PR Interval:  169 QRS Duration:  89 QT Interval:  408 QTC Calculation: 494 R Axis:   92  Text Interpretation: Sinus rhythm Borderline right axis deviation Borderline prolonged QT interval Confirmed by Beckey Downing [phone]) on 03/07/2023 10:22:01 PM  Radiology No results found.  Procedures Procedures    Medications Ordered in ED Medications  0.9 %  sodium chloride infusion (Manually program via Guardrails IV Fluids) (0 mLs Intravenous Hold 03/07/23 2212)  acetaminophen (TYLENOL) tablet 1,000 mg (has no administration in time range)    ED Course/ Medical Decision Making/ A&P Clinical Course as of 03/07/23 2328  Mon Mar 07, 2023  2158 Hemodynamically stable patient with Hgb 3.0, history of anemia requiring transfusion, symptomatic x 1 week. Transfusion orders placed. Repeat H&H to verify extremely low value of 3.0. Still feel she will need transfusion for symptomatic anemia, pallor on exam.  [SU]  2315 Repeat hemoglobin 3.1. Recheck patient, appears weak but awake, oriented, in NAD. RBCs ordered for 2 units. Hospitalist paged for admission.  [SU]  2327 Discussed with the hospitalist who accepts for admission.  [SU]    Clinical Course User Index [SU] Elpidio Anis, PA-C                                 Medical Decision Making Amount and/or Complexity of Data Reviewed Labs: ordered.  Risk Prescription drug management.  Critical Care Total time providing critical care: 20 minutes           Final Clinical Impression(s) / ED Diagnoses Final diagnoses:  Symptomatic anemia    Rx / DC Orders ED Discharge Orders     None         Danne Harbor 03/07/23 2329    Durwin Glaze,  MD 03/07/23 [phone]

## 2023-03-07 NOTE — ED Triage Notes (Addendum)
Patient said she feels like her glucose is low. Has a headache, feels like her left leg is swelling, right ear pain. This has been going on for over a week.   CBG 92 in triage.

## 2023-03-07 NOTE — ED Notes (Signed)
Dr. Janalyn Shy attending at bedside

## 2023-03-07 NOTE — H&P (Incomplete)
History and Physical    Cheryl Crane GNF:[id number] DOB: 07-29-1985 DOA: 03/07/2023  PCP: Pcp, No   Patient coming from: Home   Chief Complaint:  Chief Complaint  Patient presents with  . Hypoglycemia    HPI:  Cheryl Crane is a 37 y.o. female with medical history significant of uterine fibroid, asthma, chronic anemia, chronic iron deficiency anemia and chronic constipation presented to emergency for evaluation of lightheadedness, fatigue, shortness of breath for last 5 days.  Patient reported that she has history of fibroid in the past that causing hide anemia requiring blood transfusion last year.  Patient reported she started having vaginal bleeding on 9/20 and having persistent bleeding since then.  Patient denies any syncopal episode.  Denies any fever, chill, lower abdominal pain, nausea, vomiting, noticing any blood in her stool.  Patient was admitted in May 2024 for symptomatic anemia and received unit of blood transfusion.  Gynecologist Dr.Picken discussed the case over phone and recommended outpatient follow-up with GYN.  Patient was discharged with Megace 40 mg twice daily.  Review patient has been evaluated by GYN Dr. Shawnie Pons 03/02/2022.  Patient offered hysterectomy however she declined as she was thinking about having children.  Given patient gained weight with Megace she has been switched to Aygestin 5 mg daily.  ED Course:  At presentation to ED patient is hemodynamically stable. BMP grossly unremarkable. CBC showed low RBC 2.38, low hemoglobin 3 (baseline hemoglobin in between 7-9), hematocrit 13, low MCV 54, MCHC 12.6, elevated platelet count  686 and WBC 5.8 within normal range.  In the ED has been placed to give 2 units of blood transfusion.   Hospitalist has been contacted for further evaluation management of acute on chronic anemia.  Review of Systems:  Review of Systems  Constitutional:  Positive for malaise/fatigue. Negative for chills, fever and weight  loss.  Respiratory:  Positive for shortness of breath. Negative for cough and sputum production.   Cardiovascular:  Negative for chest pain, palpitations, orthopnea and claudication.  Gastrointestinal:  Negative for abdominal pain, blood in stool, constipation, diarrhea, heartburn, nausea and vomiting.  Genitourinary:  Negative for hematuria.  Musculoskeletal:  Negative for myalgias.  Neurological:  Positive for dizziness. Negative for headaches.  Psychiatric/Behavioral:  The patient is not nervous/anxious.     Past Medical History:  Diagnosis Date  . Asthma   . Bronchitis     History reviewed. No pertinent surgical history.   reports that she has been smoking cigarettes. She does not have any smokeless tobacco history on file. She reports that she does not drink alcohol and does not use drugs.  No Known Allergies  Family History  Problem Relation Age of Onset  . Diabetes Mother   . Breast cancer Mother   . Fibroids Mother   . Heart disease Father   . Cancer - Lung Father   . Fibroids Maternal Grandfather     Prior to Admission medications   Medication Sig Start Date End Date Taking? Authorizing Provider  acetaminophen (TYLENOL) 500 MG tablet Take 1,000 mg by mouth every 6 (six) hours as needed for mild pain.    [provider]  docusate sodium (COLACE) 100 MG capsule Take 1 capsule (100 mg total) by mouth 2 (two) times daily. Patient not taking: Reported on 12/10/2021 10/26/21   Miguel Rota, MD  ferrous sulfate 325 (65 FE) MG tablet Take 1 tablet (325 mg total) by mouth 2 (two) times daily with a meal. 10/27/21 12/10/21  Amin, Ankit C, MD  Ibuprofen-diphenhydrAMINE HCl 200-25 MG CAPS Take 2 tablets by mouth at bedtime as needed (for sleep).    [provider]  Multiple Vitamins-Minerals (MULTIVITAMIN WITH MINERALS) tablet Take 1 tablet by mouth daily.    [provider]  naproxen (NAPROSYN) 500 MG tablet Take 1 tablet (500 mg total) by mouth 2 (two)  times daily as needed for moderate pain. 02/13/22   Petrucelli, Samantha R, PA-C  norethindrone (AYGESTIN) 5 MG tablet Take 3 tablets (15 mg total) by mouth daily. 12/10/21   Reva Bores, MD  ondansetron (ZOFRAN-ODT) 4 MG disintegrating tablet Take 1 tablet (4 mg total) by mouth every 8 (eight) hours as needed for nausea or vomiting. 02/13/22   Petrucelli, Samantha R, PA-C  senna-docusate (SENOKOT-S) 8.6-50 MG tablet Take 1 tablet by mouth at bedtime as needed for moderate constipation. Patient not taking: Reported on 12/10/2021 10/26/21   Miguel Rota, MD     Physical Exam: Vitals:   03/07/23 1800 03/07/23 2103 03/07/23 2200 03/07/23 2325  BP:  128/69 123/65 128/60  Pulse:  86 83 88  Resp:  16 16 16   Temp:  98.9 F (37.2 C)  99.4 F (37.4 C)  TempSrc:  Oral  Oral  SpO2:  100% 98% 99%  Weight: 88.5 kg     Height: 5\' 1"  (1.549 m)       Physical Exam Constitutional:      General: She is not in acute distress.    Appearance: She is ill-appearing.  HENT:     Mouth/Throat:     Mouth: Mucous membranes are moist.  Eyes:     Pupils: Pupils are equal, round, and reactive to light.  Cardiovascular:     Rate and Rhythm: Normal rate and regular rhythm.     Pulses: Normal pulses.     Heart sounds: Normal heart sounds.  Pulmonary:     Effort: Pulmonary effort is normal.     Breath sounds: Normal breath sounds.  Abdominal:     General: Bowel sounds are normal.  Musculoskeletal:     Cervical back: Neck supple.     Right lower leg: No edema.     Left lower leg: No edema.  Skin:    Capillary Refill: Capillary refill takes less than 2 seconds.     Coloration: Skin is pale. Skin is not jaundiced.     Findings: No lesion or rash.  Neurological:     Mental Status: She is oriented to person, place, and time.  Psychiatric:        Mood and Affect: Mood normal.        Thought Content: Thought content normal.      Labs on Admission: I have personally reviewed following labs and imaging  studies  CBC: Recent Labs  Lab 03/07/23 2058 03/07/23 2140  WBC 5.8  --   NEUTROABS 3.1  --   HGB 3.0* 3.1*  HCT 13.0* 13.8*  MCV 54.6*  --   PLT 689*  --    Basic Metabolic Panel: Recent Labs  Lab 03/07/23 1808  NA 136  K 3.8  CL 107  CO2 22  GLUCOSE 97  BUN 5*  CREATININE 0.53  CALCIUM 8.3*   GFR: Estimated Creatinine Clearance: 97.4 mL/min (by C-G formula based on SCr of 0.53 mg/dL). Liver Function Tests: No results for input(s): "AST", "ALT", "ALKPHOS", "BILITOT", "PROT", "ALBUMIN" in the last 168 hours. No results for input(s): "LIPASE", "AMYLASE" in the last 168 hours.  No results for input(s): "AMMONIA" in the last 168 hours. Coagulation Profile: No results for input(s): "INR", "PROTIME" in the last 168 hours. Cardiac Enzymes: No results for input(s): "CKTOTAL", "CKMB", "CKMBINDEX", "TROPONINI", "TROPONINIHS" in the last 168 hours. BNP (last 3 results) No results for input(s): "BNP" in the last 8760 hours. HbA1C: No results for input(s): "HGBA1C" in the last 72 hours. CBG: Recent Labs  Lab 03/07/23 1757  GLUCAP 92   Lipid Profile: No results for input(s): "CHOL", "HDL", "LDLCALC", "TRIG", "CHOLHDL", "LDLDIRECT" in the last 72 hours. Thyroid Function Tests: No results for input(s): "TSH", "T4TOTAL", "FREET4", "T3FREE", "THYROIDAB" in the last 72 hours. Anemia Panel: No results for input(s): "VITAMINB12", "FOLATE", "FERRITIN", "TIBC", "IRON", "RETICCTPCT" in the last 72 hours. Urine analysis:    Component Value Date/Time   COLORURINE YELLOW 10/25/2021 2154   APPEARANCEUR CLEAR 10/25/2021 2154   LABSPEC 1.044 (H) 10/25/2021 2154   PHURINE 5.0 10/25/2021 2154   GLUCOSEU NEGATIVE 10/25/2021 2154   HGBUR MODERATE (A) 10/25/2021 2154   BILIRUBINUR NEGATIVE 10/25/2021 2154   KETONESUR NEGATIVE 10/25/2021 2154   PROTEINUR NEGATIVE 10/25/2021 2154   UROBILINOGEN 0.2 08/16/2012 1030   NITRITE NEGATIVE 10/25/2021 2154   LEUKOCYTESUR NEGATIVE 10/25/2021  2154    Radiological Exams on Admission: I have personally reviewed images No results found.  EKG: My personal interpretation of EKG shows: EKG showed sinus rhythm heart rate 88.  There is no ST-T wave abnormality     Assessment/Plan: Principal Problem:   Symptomatic anemia Active Problems:   Fibroid uterus   Asthma, chronic   History of menorrhagia   Chronic constipation    Assessment and Plan: Symptomatic anemia 2nd to menorrhagia fro uterine fibroid Acute on chronic anemia -Patient presenting with generalized weakness, shortness of breath and lightheadedness for last 5 to 7 days.  Reported vaginal bleeding since 9/10 and ongoing. - She has history of chronic anemia secondary to menorrhagia from uterine fibroid,baseline hemoglobin between 7-9. -CBC showed RBC 3.38, hemoglobin 3g/dl, low hematocrit 13, low MCV 54.   Thrombocytosis - Elevated platelet count 689 likely reactive in the setting of acute blood loss anemia. - Continue to monitor platelet level   Chronic iron deficiency anemia   History of asthma   Constipation -      DVT prophylaxis:  {Blank single:19197::"Lovenox","SQ Heparin","IV heparin gtts","Xarelto","Eliquis","Coumadin","SCDs","***"} Code Status:  {Blank single:19197::"Full Code","DNR with Intubation","DNR/DNI(Do NOT Intubate)","Comfort Care","***"} Diet:  Family Communication:  ***  Disposition Plan:  ***  Consults:  ***  Admission status:   {Blank single:19197::"Observation","Inpatient"}, {Blank single:19197::"Med-Surg","Telemetry bed","Step Down Unit"}  Severity of Illness: {Observation/Inpatient:21159}    Tereasa Coop, MD [facility]  How to contact the [facility] Attending or Consulting provider 7A - 7P or covering provider during after hours 7P -7A, for this patient.  Check the care team in [facility] and look for a) attending/consulting TR[facility] provider listed and b) the [facility] team listed Log into [email or web address] and use [place]'s  universal password to access. If you do not have the password, please contact the hospital operator. Locate the [facility] provider you are looking for under [facility] and page to a number that you can be directly reached. If you still have difficulty reaching the provider, please page the [facility] (Director on Call) for the Hospitalists listed on amion for assistance.  03/08/2023, 12:00 AM

## 2023-03-07 NOTE — H&P (Signed)
History and Physical    Cheryl Crane:[id number] DOB: 1985-09-16 DOA: 03/07/2023  PCP: Pcp, No   Patient coming from: Home   Chief Complaint:  Chief Complaint  Patient presents with   Hypoglycemia    HPI:  Cheryl Crane is a 37 y.o. female with medical history significant of uterine fibroid, asthma, chronic anemia, chronic iron deficiency anemia and chronic constipation presented to emergency for evaluation of lightheadedness, fatigue, shortness of breath for last 5 days.  Patient reported that she has history of fibroid in the past that causing hide anemia requiring blood transfusion last year.  Patient reported she started having vaginal bleeding on 9/20 and having persistent bleeding since then.  Patient denies any syncopal episode.  Denies any fever, chill, lower abdominal pain, nausea, vomiting, noticing any blood in her stool.  Patient was admitted in May 2024 for symptomatic anemia and received unit of blood transfusion.  Gynecologist Dr.Picken discussed the case over phone and recommended outpatient follow-up with GYN.  Patient was discharged with Megace 40 mg twice daily.  Review patient has been evaluated by GYN Dr. Shawnie Pons 03/02/2022.  Patient offered hysterectomy however she declined as she was thinking about having children.  Given patient gained weight with Megace she has been switched to Aygestin 5 mg daily.  ED Course:  At presentation to ED patient is hemodynamically stable. BMP grossly unremarkable. CBC showed low RBC 2.38, low hemoglobin 3 (baseline hemoglobin in between 7-9), hematocrit 13, low MCV 54, MCHC 12.6, elevated platelet count  686 and WBC 5.8 within normal range.  In the ED has been placed to give 2 units of blood transfusion.   Hospitalist has been contacted for further evaluation management of acute on chronic anemia.  Review of Systems:  Review of Systems  Constitutional:  Positive for malaise/fatigue. Negative for chills, fever and weight loss.   Respiratory:  Positive for shortness of breath. Negative for cough and sputum production.   Cardiovascular:  Negative for chest pain, palpitations, orthopnea and claudication.  Gastrointestinal:  Negative for abdominal pain, blood in stool, constipation, diarrhea, heartburn, nausea and vomiting.  Genitourinary:  Negative for hematuria.  Musculoskeletal:  Negative for myalgias.  Neurological:  Positive for dizziness. Negative for headaches.  Psychiatric/Behavioral:  The patient is not nervous/anxious.     Past Medical History:  Diagnosis Date   Asthma    Bronchitis     History reviewed. No pertinent surgical history.   reports that she has been smoking cigarettes. She does not have any smokeless tobacco history on file. She reports that she does not drink alcohol and does not use drugs.  No Known Allergies  Family History  Problem Relation Age of Onset   Diabetes Mother    Breast cancer Mother    Fibroids Mother    Heart disease Father    Cancer - Lung Father    Fibroids Maternal Grandfather     Prior to Admission medications   Medication Sig Start Date End Date Taking? Authorizing Provider  acetaminophen (TYLENOL) 500 MG tablet Take 1,000 mg by mouth every 6 (six) hours as needed for mild pain.    [provider]  docusate sodium (COLACE) 100 MG capsule Take 1 capsule (100 mg total) by mouth 2 (two) times daily. Patient not taking: Reported on 12/10/2021 10/26/21   Miguel Rota, MD  ferrous sulfate 325 (65 FE) MG tablet Take 1 tablet (325 mg total) by mouth 2 (two) times daily with a meal. 10/27/21 12/10/21  Amin, Ankit C, MD  Ibuprofen-diphenhydrAMINE HCl 200-25 MG CAPS Take 2 tablets by mouth at bedtime as needed (for sleep).    [provider]  Multiple Vitamins-Minerals (MULTIVITAMIN WITH MINERALS) tablet Take 1 tablet by mouth daily.    [provider]  naproxen (NAPROSYN) 500 MG tablet Take 1 tablet (500 mg total) by mouth 2 (two) times daily as  needed for moderate pain. 02/13/22   Petrucelli, Samantha R, PA-C  norethindrone (AYGESTIN) 5 MG tablet Take 3 tablets (15 mg total) by mouth daily. 12/10/21   Reva Bores, MD  ondansetron (ZOFRAN-ODT) 4 MG disintegrating tablet Take 1 tablet (4 mg total) by mouth every 8 (eight) hours as needed for nausea or vomiting. 02/13/22   Petrucelli, Samantha R, PA-C  senna-docusate (SENOKOT-S) 8.6-50 MG tablet Take 1 tablet by mouth at bedtime as needed for moderate constipation. Patient not taking: Reported on 12/10/2021 10/26/21   Miguel Rota, MD     Physical Exam: Vitals:   03/08/23 0119 03/08/23 0146 03/08/23 0325 03/08/23 0423  BP:  129/68 120/73 123/79  Pulse:  79    Resp:  17 (!) 21 18  Temp: 98.8 F (37.1 C) 98.8 F (37.1 C) 98.2 F (36.8 C) 98.2 F (36.8 C)  TempSrc: Oral Oral Oral Oral  SpO2:  96% 99% 100%  Weight:      Height:        Physical Exam Constitutional:      General: She is not in acute distress.    Appearance: She is ill-appearing.  HENT:     Mouth/Throat:     Mouth: Mucous membranes are moist.  Eyes:     Pupils: Pupils are equal, round, and reactive to light.  Cardiovascular:     Rate and Rhythm: Normal rate and regular rhythm.     Pulses: Normal pulses.     Heart sounds: Normal heart sounds.  Pulmonary:     Effort: Pulmonary effort is normal.     Breath sounds: Normal breath sounds.  Abdominal:     General: Bowel sounds are normal.  Musculoskeletal:     Cervical back: Neck supple.     Right lower leg: No edema.     Left lower leg: No edema.  Skin:    Capillary Refill: Capillary refill takes less than 2 seconds.     Coloration: Skin is pale. Skin is not jaundiced.     Findings: No lesion or rash.  Neurological:     Mental Status: She is oriented to person, place, and time.  Psychiatric:        Mood and Affect: Mood normal.        Thought Content: Thought content normal.      Labs on Admission: I have personally reviewed following labs and imaging  studies  CBC: Recent Labs  Lab 03/07/23 2058 03/07/23 2140  WBC 5.8  --   NEUTROABS 3.1  --   HGB 3.0* 3.1*  HCT 13.0* 13.8*  MCV 54.6*  --   PLT 689*  --    Basic Metabolic Panel: Recent Labs  Lab 03/07/23 1808  NA 136  K 3.8  CL 107  CO2 22  GLUCOSE 97  BUN 5*  CREATININE 0.53  CALCIUM 8.3*   GFR: Estimated Creatinine Clearance: 97.4 mL/min (by C-G formula based on SCr of 0.53 mg/dL). Liver Function Tests: No results for input(s): "AST", "ALT", "ALKPHOS", "BILITOT", "PROT", "ALBUMIN" in the last 168 hours. No results for input(s): "LIPASE", "AMYLASE" in the  last 168 hours. No results for input(s): "AMMONIA" in the last 168 hours. Coagulation Profile: No results for input(s): "INR", "PROTIME" in the last 168 hours. Cardiac Enzymes: No results for input(s): "CKTOTAL", "CKMB", "CKMBINDEX", "TROPONINI", "TROPONINIHS" in the last 168 hours. BNP (last 3 results) No results for input(s): "BNP" in the last 8760 hours. HbA1C: No results for input(s): "HGBA1C" in the last 72 hours. CBG: Recent Labs  Lab 03/07/23 1757  GLUCAP 92   Lipid Profile: No results for input(s): "CHOL", "HDL", "LDLCALC", "TRIG", "CHOLHDL", "LDLDIRECT" in the last 72 hours. Thyroid Function Tests: No results for input(s): "TSH", "T4TOTAL", "FREET4", "T3FREE", "THYROIDAB" in the last 72 hours. Anemia Panel: Recent Labs    03/07/23 2140  VITAMINB12 602  FOLATE 10.0  FERRITIN 1*  TIBC 585*  IRON 11*  RETICCTPCT 1.4   Urine analysis:    Component Value Date/Time   COLORURINE YELLOW 10/25/2021 2154   APPEARANCEUR CLEAR 10/25/2021 2154   LABSPEC 1.044 (H) 10/25/2021 2154   PHURINE 5.0 10/25/2021 2154   GLUCOSEU NEGATIVE 10/25/2021 2154   HGBUR MODERATE (A) 10/25/2021 2154   BILIRUBINUR NEGATIVE 10/25/2021 2154   KETONESUR NEGATIVE 10/25/2021 2154   PROTEINUR NEGATIVE 10/25/2021 2154   UROBILINOGEN 0.2 08/16/2012 1030   NITRITE NEGATIVE 10/25/2021 2154   LEUKOCYTESUR NEGATIVE  10/25/2021 2154    Radiological Exams on Admission: I have personally reviewed images DG CHEST PORT 1 VIEW  Result Date: 03/08/2023 CLINICAL DATA:  Hyperglycemia and shortness of breath. EXAM: PORTABLE CHEST 1 VIEW COMPARISON:  June 07, 2015 FINDINGS: The cardiac silhouette is mildly enlarged. Mild linear scarring and/or atelectasis is seen within the mid left lung. No pleural effusion or pneumothorax is identified. The visualized skeletal structures are unremarkable. IMPRESSION: 1. Mild cardiomegaly with mild mid left lung linear scarring and/or atelectasis. Electronically Signed   By: Aram Candela M.D.   On: 03/08/2023 00:44    EKG: My personal interpretation of EKG shows: EKG showed sinus rhythm heart rate 88.  There is no ST-T wave abnormality     Assessment/Plan: Principal Problem:   Symptomatic anemia Active Problems:   Fibroid uterus   Asthma, chronic   History of menorrhagia   Chronic constipation    Assessment and Plan: Symptomatic anemia 2nd to menorrhagia fro uterine fibroid Acute on chronic anemia Chronic iron patient's anemia -Patient presenting with generalized weakness, shortness of breath and lightheadedness for last 5 to 7 days.  Reported vaginal bleeding since 9/10 and ongoing. - She has history of chronic anemia secondary to menorrhagia from uterine fibroid,baseline hemoglobin between 7-9. -CBC showed RBC 3.38, hemoglobin 3g/dl, low hematocrit 13, low MCV 54.  -Checking anemia panel. -Patient follows gynecology Dr. Shawnie Pons outpatient.  She has been declined hysterectomy in the past.  Due to weight gain Megace has been changed Aygestin 15 mg daily in August however patient reported also not taking Aygestin as it caused her abdominal swelling. - Discussed case with on-call gynecology Dr. Jolayne Panther over phone. Recommended to continue blood transfusion and Dr. Jolayne Panther will arrange follow-up to clinic ASAP  after discharge.  No other surgical intervention needs at  this time.  Also recommended to start either Megace or Aygestin until fibroid removal or hysterectomy. -Patient declining to restart either Megace or Aygestin at this time. -Plan to continue blood transfusion. - In the ED 2 units of PRBC has been ordered. - Giving total 4 units of PRBC.  Continue to monitor H&H 3 times daily and transfuse as needed to keep hemoglobin above 7. -  Checking chest x-ray.  Will give 1 dose of IV Lasix 20 mg after 2  -Continue cardiac monitoring. -Continue supplemental oxygen to keep O2 sat above 92%. -Appreciate gynecology input and recommendation.   Thrombocytosis - Elevated platelet count 689 likely reactive in the setting of acute blood loss anemia. - Continue to monitor platelet level.   Chronic iron deficiency anemia -Continue anemia panel. - Holding any iron transfusion at this time as patient getting blood transfusion. -Continue oral iron supplements.  History of asthma Stable -Complaining of shortness of breath in the setting of anemia.  O2 sat 100% room air. - At home patient is not any SABA.  Continue to monitor  Constipation - Continue Senokot as needed.   DVT prophylaxis:  SCDs.  Deferring pharmacological prophylaxis in the setting of low hemoglobin and menorrhagia Code Status:  Full Code Diet: Regular diet Family Communication: Discussed treatment plan with patient's mother and patient at bedside Disposition Plan: Goal to improve hemoglobin >7.  Transfuse as needed.  Tentative discharge to home next 2 to 3 days.  Need to follow-up with GYN outpatient. Consults: Gynecology Admission status:   Inpatient, progressive unit  Severity of Illness: The appropriate patient status for this patient is INPATIENT. Inpatient status is judged to be reasonable and necessary in order to provide the required intensity of service to ensure the patient's safety. The patient's presenting symptoms, physical exam findings, and initial radiographic and  laboratory data in the context of their chronic comorbidities is felt to place them at high risk for further clinical deterioration. Furthermore, it is not anticipated that the patient will be medically stable for discharge from the hospital within 2 midnights of admission.   * I certify that at the point of admission it is my clinical judgment that the patient will require inpatient hospital care spanning beyond 2 midnights from the point of admission due to high intensity of service, high risk for further deterioration and high frequency of surveillance required.Marland Kitchen    Tereasa Coop, MD [facility]  How to contact the [facility] Attending or Consulting provider 7A - 7P or covering provider during after hours 7P -7A, for this patient.  Check the care team in [facility] and look for a) attending/consulting [facility] provider listed and b) the [facility] team listed Log into [email or web address] and use [place]'s universal password to access. If you do not have the password, please contact the hospital operator. Locate the [facility] provider you are looking for under [facility] and page to a number that you can be directly reached. If you still have difficulty reaching the provider, please page the [facility] (Director on Call) for the Hospitalists listed on amion for assistance.  03/08/2023, 4:28 AM

## 2023-03-07 NOTE — ED Notes (Signed)
This RN called blood back, advised pt's blood is ready at this time, NT to go collect RBC at this time form blood bank

## 2023-03-08 ENCOUNTER — Inpatient Hospital Stay (HOSPITAL_COMMUNITY): Payer: Self-pay

## 2023-03-08 DIAGNOSIS — D75839 Thrombocytosis, unspecified: Secondary | ICD-10-CM | POA: Diagnosis present

## 2023-03-08 HISTORY — DX: Thrombocytosis, unspecified: D75.839

## 2023-03-08 LAB — CBC
HCT: 25.2 % — ABNORMAL LOW (ref 36.0–46.0)
Hemoglobin: 7.4 g/dL — ABNORMAL LOW (ref 12.0–15.0)
MCH: 20.5 pg — ABNORMAL LOW (ref 26.0–34.0)
MCHC: 29.4 g/dL — ABNORMAL LOW (ref 30.0–36.0)
MCV: 69.8 fL — ABNORMAL LOW (ref 80.0–100.0)
Platelets: 570 10*3/uL — ABNORMAL HIGH (ref 150–400)
RBC: 3.61 MIL/uL — ABNORMAL LOW (ref 3.87–5.11)
WBC: 6.4 10*3/uL (ref 4.0–10.5)
nRBC: 1.2 % — ABNORMAL HIGH (ref 0.0–0.2)

## 2023-03-08 LAB — HEMOGLOBIN AND HEMATOCRIT, BLOOD
HCT: 26.3 % — ABNORMAL LOW (ref 36.0–46.0)
Hemoglobin: 7.7 g/dL — ABNORMAL LOW (ref 12.0–15.0)

## 2023-03-08 LAB — COMPREHENSIVE METABOLIC PANEL
ALT: 12 U/L (ref 0–44)
AST: 16 U/L (ref 15–41)
Albumin: 4 g/dL (ref 3.5–5.0)
Alkaline Phosphatase: 117 U/L (ref 38–126)
Anion gap: 6 (ref 5–15)
BUN: <5 mg/dL — ABNORMAL LOW (ref 6–20)
CO2: 23 mmol/L (ref 22–32)
Calcium: 8.4 mg/dL — ABNORMAL LOW (ref 8.9–10.3)
Chloride: 105 mmol/L (ref 98–111)
Creatinine, Ser: 0.53 mg/dL (ref 0.44–1.00)
GFR, Estimated: >60 mL/min (ref >60)
Glucose, Bld: 109 mg/dL — ABNORMAL HIGH (ref 70–99)
Potassium: 3.6 mmol/L (ref 3.5–5.1)
Sodium: 134 mmol/L — ABNORMAL LOW (ref 135–145)
Total Bilirubin: 2.2 mg/dL — ABNORMAL HIGH (ref 0.3–1.2)
Total Protein: 7.4 g/dL (ref 6.5–8.1)

## 2023-03-08 LAB — GLUCOSE, CAPILLARY: Glucose-Capillary: 86 mg/dL (ref 70–99)

## 2023-03-08 LAB — RETICULOCYTES
Immature Retic Fract: 13 % (ref 2.3–15.9)
RBC.: 2.45 MIL/uL — ABNORMAL LOW (ref 3.87–5.11)
Retic Count, Absolute: 34.5 10*3/uL (ref 19.0–186.0)
Retic Ct Pct: 1.4 % (ref 0.4–3.1)

## 2023-03-08 LAB — PREPARE RBC (CROSSMATCH)

## 2023-03-08 LAB — IRON AND TIBC
Iron: 11 ug/dL — ABNORMAL LOW (ref 28–170)
Saturation Ratios: 2 % — ABNORMAL LOW (ref 10.4–31.8)
TIBC: 585 ug/dL — ABNORMAL HIGH (ref 250–450)
UIBC: 574 ug/dL

## 2023-03-08 LAB — FERRITIN: Ferritin: 1 ng/mL — ABNORMAL LOW (ref 11–307)

## 2023-03-08 LAB — VITAMIN B12: Vitamin B-12: 602 pg/mL (ref 180–914)

## 2023-03-08 LAB — FOLATE: Folate: 10 ng/mL (ref >5.9)

## 2023-03-08 MED ORDER — MORPHINE SULFATE (PF) 2 MG/ML IV SOLN
2.0000 mg | Freq: Once | INTRAVENOUS | Status: AC
  Administered 2023-03-08: 2 mg via INTRAVENOUS
  Filled 2023-03-08: qty 1

## 2023-03-08 MED ORDER — SODIUM CHLORIDE 0.9 % IV SOLN
100.0000 mg | Freq: Once | INTRAVENOUS | Status: AC
  Administered 2023-03-08: 100 mg via INTRAVENOUS
  Filled 2023-03-08: qty 5

## 2023-03-08 MED ORDER — MEGESTROL ACETATE 40 MG PO TABS
40.0000 mg | ORAL_TABLET | Freq: Two times a day (BID) | ORAL | Status: DC
  Administered 2023-03-08 – 2023-03-09 (×2): 40 mg via ORAL
  Filled 2023-03-08 (×2): qty 1

## 2023-03-08 NOTE — Plan of Care (Signed)
  Problem: Clinical Measurements: Goal: Ability to maintain clinical measurements within normal limits will improve Outcome: Progressing   Problem: Safety: Goal: Ability to remain free from injury will improve Outcome: Progressing   

## 2023-03-08 NOTE — Plan of Care (Signed)

## 2023-03-08 NOTE — TOC Initial Note (Signed)
Transition of Care [facility]) - Initial/Assessment Note    Patient Details  Name: Cheryl Crane MRN: [id number] Date of Birth: 30-Nov-1985  Transition of Care [facility]) CM/SW Contact:    Howell Rucks, RN Phone Number: 03/08/2023, 12:00 PM  Clinical Narrative:  Met with pt at bedside to introduce role of TOC/NCM and review for dc planning. Pt reports she currently does not have a PCP in place, agreeable to [facility] scheduling a post hospital follow up appt at one of [facility]. Appt scheduled at [facility], [address] [place], [place], [place] [address]. Monday, March 14, 2023 at 2:20pm, added to AVS. Pt reports she feels safe returning home with support from her family, reports no current home care services or home DME,  confirmed she has transportation available at discharge.  TOC will continue to follow.             Expected Discharge Plan: Home/Self Care Barriers to Discharge: Continued Medical Work up   Patient Goals and CMS Choice Patient states their goals for this hospitalization and ongoing recovery are:: home with family support          Expected Discharge Plan and Services                                              Prior Living Arrangements/Services   Lives with:: Parents Patient language and need for interpreter reviewed:: Yes Do you feel safe going back to the place where you live?: Yes      Need for Family Participation in Patient Care: Yes (Comment) Care giver support system in place?: Yes (comment)   Criminal Activity/Legal Involvement Pertinent to Current Situation/Hospitalization: No - Comment as needed  Activities of Daily Living   ADL Screening (condition at time of admission) Does the patient have a NEW difficulty with bathing/dressing/toileting/self-feeding that is expected to last >3 days?: No Does the patient have a NEW difficulty with getting in/out of bed, walking, or climbing stairs that is expected to last  >3 days?: No Does the patient have a NEW difficulty with communication that is expected to last >3 days?: No Is the patient deaf or have difficulty hearing?: Yes Does the patient have difficulty seeing, even when wearing glasses/contacts?: Yes Does the patient have difficulty concentrating, remembering, or making decisions?: Yes  Permission Sought/Granted Permission sought to share information with : Case Manager Permission granted to share information with : Yes, Verbal Permission Granted  Share Information with NAME: Fannie Knee, RN           Emotional Assessment Appearance:: Appears stated age Attitude/Demeanor/Rapport: Gracious Affect (typically observed): Accepting Orientation: : Oriented to Self, Oriented to Place, Oriented to  Time, Oriented to Situation Alcohol / Substance Use: Not Applicable Psych Involvement: No (comment)  Admission diagnosis:  Symptomatic anemia [D64.9] Acute on chronic anemia [D64.9] Patient Active Problem List   Diagnosis Date Noted   History of menorrhagia 03/07/2023   Chronic constipation 03/07/2023   Menorrhagia with regular cycle 12/10/2021   Tobacco use 10/25/2021   Obesity (BMI 30-39.9) 10/25/2021   Symptomatic anemia 06/08/2015   Asthma, chronic 06/08/2015   Fibroid uterus 07/31/2012   PCP:  Pcp, No Pharmacy:   [id number] - [place], [place] - [address] AT [facility] OF [address]  [place] [address] Phone: [phone] Fax: [phone]     Social Determinants of Health (SDOH) Social History: SDOH Screenings   Food Insecurity: No Food Insecurity (03/08/2023)  Recent Concern: Food Insecurity - Food Insecurity Present (03/08/2023)  Housing: Patient Declined (03/08/2023)  Transportation Needs: No Transportation Needs (03/08/2023)  Utilities: Not At Risk (03/08/2023)  Depression (PHQ2-9): Medium Risk (12/10/2021)  Tobacco Use: High Risk (03/07/2023)   SDOH Interventions:      Readmission Risk Interventions    03/08/2023   11:58 AM  Readmission Risk Prevention Plan  Post Dischage Appt Complete  Medication Screening Complete  Transportation Screening Complete

## 2023-03-08 NOTE — ED Notes (Signed)
Receiving RN Jacolyn Reedy has agreed to accept [facility] once pt has arrived to inpatient unit, all questions and concerns address.This RN to transport pt with cardiac monitor and pt has blood infusing at this time

## 2023-03-08 NOTE — ED Notes (Addendum)
ED TO INPATIENT HANDOFF REPORT  ED Nurse Name and Phone #: Elnita Maxwell [phone]  S Name/Age/Gender Cheryl Crane 37 y.o. female Room/Bed: WA13/WA13  Code Status   Code Status: Full Code  Home/SNF/Other Home Patient oriented to: self, place, time, and situation Is this baseline? Yes   Triage Complete: Triage complete  Chief Complaint Acute on chronic anemia [D64.9]  Triage Note Patient said she feels like her glucose is low. Has a headache, feels like her left leg is swelling, right ear pain. This has been going on for over a week.   CBG 92 in triage.    Allergies No Known Allergies  Level of Care/Admitting Diagnosis ED Disposition     ED Disposition  Admit   Condition  --   Comment  Hospital Area: [facility] [[id number]]  Level of Care: Progressive [102]  Admit to Progressive based on following criteria: Other see comments  Comments: Active vaginal bleeding.  Hemoglobin 3.  May admit patient to [facility] or [facility] if equivalent level of care is available:: No  Covid Evaluation: Asymptomatic - no recent exposure (last 10 days) testing not required  Diagnosis: Acute on chronic anemia [1308657]  Admitting Physician: Tereasa Coop [[id number]]  Attending Physician: Tereasa Coop [[id number]]  Certification:: I certify this patient will need inpatient services for at least 2 midnights  Expected Medical Readiness: 03/11/2023          B Medical/Surgery History Past Medical History:  Diagnosis Date   Asthma    Bronchitis    History reviewed. No pertinent surgical history.   A IV Location/Drains/Wounds Patient Lines/Drains/Airways Status     Active Line/Drains/Airways     Name Placement date Placement time Site Days   Peripheral IV 03/07/23 20 G Anterior;Left Forearm 03/07/23  2140  Forearm  1   Peripheral IV 03/07/23 Anterior;Proximal;Right Forearm 03/07/23  2148  Forearm  1            Intake/Output Last 24 hours No intake or  output data in the 24 hours ending 03/08/23 0033  Labs/Imaging Results for orders placed or performed during the hospital encounter of 03/07/23 (from the past 48 hour(s))  CBG monitoring, ED     Status: None   Collection Time: 03/07/23  5:57 PM  Result Value Ref Range   Glucose-Capillary 92 70 - 99 mg/dL    Comment: Glucose reference range applies only to samples taken after fasting for at least 8 hours.  Basic metabolic panel     Status: Abnormal   Collection Time: 03/07/23  6:08 PM  Result Value Ref Range   Sodium 136 135 - 145 mmol/L   Potassium 3.8 3.5 - 5.1 mmol/L   Chloride 107 98 - 111 mmol/L   CO2 22 22 - 32 mmol/L   Glucose, Bld 97 70 - 99 mg/dL    Comment: Glucose reference range applies only to samples taken after fasting for at least 8 hours.   BUN 5 (L) 6 - 20 mg/dL   Creatinine, Ser 4.40 0.44 - 1.00 mg/dL   Calcium 8.3 (L) 8.9 - 10.3 mg/dL   GFR, Estimated >10 >27 mL/min    Comment: (NOTE) Calculated using the CKD-EPI Creatinine Equation (2021)    Anion gap 7 5 - 15    Comment: Performed at [facility], [address] [place], [place] [address]  CBC with Differential/Platelet     Status: Abnormal   Collection Time: 03/07/23  8:58 PM  Result Value Ref  Range   WBC 5.8 4.0 - 10.5 K/uL   RBC 2.38 (L) 3.87 - 5.11 MIL/uL   Hemoglobin 3.0 (LL) 12.0 - 15.0 g/dL    Comment: REPEATED TO VERIFY Reticulocyte Hemoglobin testing may be clinically indicated, consider ordering this additional test [id number] THIS CRITICAL RESULT HAS VERIFIED AND BEEN CALLED TO A.METCALF,RN BY ATCHISON,MARY ON 09 30 2024 AT 2123, AND HAS BEEN READ BACK.     HCT 13.0 (L) 36.0 - 46.0 %   MCV 54.6 (L) 80.0 - 100.0 fL   MCH 12.6 (L) 26.0 - 34.0 pg   MCHC 23.1 (L) 30.0 - 36.0 g/dL   RDW 30.8 (H) 65.7 - 84.6 %   Platelets 689 (H) 150 - 400 K/uL   nRBC 0.5 (H) 0.0 - 0.2 %   Neutrophils Relative % 53 %   Neutro Abs 3.1 1.7 - 7.7 K/uL   Lymphocytes Relative 38 %   Lymphs Abs  2.2 0.7 - 4.0 K/uL   Monocytes Relative 8 %   Monocytes Absolute 0.5 0.1 - 1.0 K/uL   Eosinophils Relative 1 %   Eosinophils Absolute 0.1 0.0 - 0.5 K/uL   Basophils Relative 0 %   Basophils Absolute 0.0 0.0 - 0.1 K/uL   Immature Granulocytes 0 %   Abs Immature Granulocytes 0.01 0.00 - 0.07 K/uL    Comment: Performed at [facility], [address] [place], [place] [address]  Prepare RBC (crossmatch)     Status: None   Collection Time: 03/07/23  9:40 PM  Result Value Ref Range   Order Confirmation      ORDER PROCESSED BY BLOOD BANK Performed at [facility], [address], [place], [place] [address]   Type and screen [facility]     Status: None (Preliminary result)   Collection Time: 03/07/23  9:40 PM  Result Value Ref Range   ABO/RH(D) A POS    Antibody Screen NEG    Sample Expiration 03/10/2023,2359    Unit Number [id number]    Blood Component Type RED CELLS,LR    Unit division 00    Status of Unit ALLOCATED    Transfusion Status OK TO TRANSFUSE    Crossmatch Result Compatible    Unit Number [id number]    Blood Component Type RED CELLS,LR    Unit division 00    Status of Unit ISSUED    Transfusion Status OK TO TRANSFUSE    Crossmatch Result      Compatible Performed at [facility], [address], [place], [place] [address]   Hemoglobin and hematocrit, blood     Status: Abnormal   Collection Time: 03/07/23  9:40 PM  Result Value Ref Range   Hemoglobin 3.1 (LL) 12.0 - 15.0 g/dL    Comment: CRITICAL VALUE NOTED.  VALUE IS CONSISTENT WITH PREVIOUSLY REPORTED AND CALLED VALUE. REPEATED TO VERIFY    HCT 13.8 (L) 36.0 - 46.0 %    Comment: Performed at [facility], [address] [place], [place] [address]  Vitamin B12     Status: None   Collection Time: 03/07/23  9:40 PM  Result Value Ref Range   Vitamin B-12 602 180 - 914 pg/mL    Comment: (NOTE) This assay is not  validated for testing neonatal or myeloproliferative syndrome specimens for Vitamin B12 levels. Performed at [facility], [address], [place], [place] [address]   Folate     Status: None  Collection Time: 03/07/23  9:40 PM  Result Value Ref Range   Folate 10.0 >5.9 ng/mL    Comment: Performed at [facility], [address] [place], [place] [address]  Iron and TIBC     Status: Abnormal   Collection Time: 03/07/23  9:40 PM  Result Value Ref Range   Iron 11 (L) 28 - 170 ug/dL   TIBC 604 (H) 540 - 981 ug/dL   Saturation Ratios 2 (L) 10.4 - 31.8 %   UIBC 574 ug/dL    Comment: Performed at [facility], [address] [place], [place] [address]  Ferritin     Status: Abnormal   Collection Time: 03/07/23  9:40 PM  Result Value Ref Range   Ferritin 1 (L) 11 - 307 ng/mL    Comment: Performed at [facility], [address] [place], [place] [address]  Reticulocytes     Status: Abnormal   Collection Time: 03/07/23  9:40 PM  Result Value Ref Range   Retic Ct Pct 1.4 0.4 - 3.1 %   RBC. 2.45 (L) 3.87 - 5.11 MIL/uL   Retic Count, Absolute 34.5 19.0 - 186.0 K/uL   Immature Retic Fract 13.0 2.3 - 15.9 %    Comment: Performed at [facility], [address], [place], [place] [address]   No results found.  Pending Labs Unresulted Labs (From admission, onward)     Start     Ordered   03/08/23 0800  Hemoglobin and hematocrit, blood  3 times daily,   R (with TIMED occurrences)      03/07/23 2341   03/08/23 0500  HIV Antibody (routine testing w rflx)  (HIV Antibody (Routine testing w reflex) panel)  Once,   R        03/07/23 2352   03/08/23 0500  Comprehensive metabolic panel  Daily,   R     Comments: Please collect blood in pediatric tubes    03/08/23 0004   03/07/23 2342  Prepare RBC (crossmatch)  (Blood Administration Adult)  Once,   R       Question Answer Comment  # of Units 2 units    Transfusion Indications Hemoglobin < 7 gm/dL and symptomatic   Number of Units to Keep Ahead NO units ahead   If emergent release call blood bank Not emergent release      03/07/23 2341   03/07/23 2341  Hepatic function panel  Add-on,   AD        03/07/23 2341   03/07/23 1802  CBC with Differential  Once,   STAT        03/07/23 1801            Vitals/Pain Today's Vitals   03/07/23 2200 03/07/23 2325 03/08/23 0001 03/08/23 0004  BP: 123/65 128/60 133/74   Pulse: 83 88 87   Resp: 16 16 16    Temp:  99.4 F (37.4 C) 99 F (37.2 C)   TempSrc:  Oral Oral   SpO2: 98% 99% 97%   Weight:      Height:      PainSc:    0-No pain    Isolation Precautions No active isolations  Medications Medications  docusate sodium (COLACE) capsule 100 mg (has no administration in time range)  senna-docusate (Senokot-S) tablet 1 tablet (has no administration in time range)  ferrous sulfate tablet 325 mg (has no administration in time range)  multivitamin with minerals tablet 1 tablet (has no  administration in time range)  sodium chloride flush (NS) 0.9 % injection 3 mL (3 mLs Intravenous Given 03/08/23 0010)  sodium chloride flush (NS) 0.9 % injection 3 mL (3 mLs Intravenous Given 03/08/23 0010)  sodium chloride flush (NS) 0.9 % injection 3 mL (has no administration in time range)  0.9 %  sodium chloride infusion (has no administration in time range)  acetaminophen (TYLENOL) tablet 650 mg (has no administration in time range)    Or  acetaminophen (TYLENOL) suppository 650 mg (has no administration in time range)  ondansetron (ZOFRAN) tablet 4 mg (has no administration in time range)    Or  ondansetron (ZOFRAN) injection 4 mg (has no administration in time range)  0.9 %  sodium chloride infusion (Manually program via Guardrails IV Fluids) (0 mLs Intravenous Hold 03/08/23 0000)  furosemide (LASIX) injection 20 mg (0 mg Intravenous Hold 03/08/23 0001)  diphenhydrAMINE (BENADRYL) injection 25 mg  (has no administration in time range)  0.9 %  sodium chloride infusion (Manually program via Guardrails IV Fluids) ( Intravenous New Bag/Given 03/07/23 2336)    Mobility walks     R Recommendations: See Admitting Provider Note  Report given to:   Additional Notes: Pt is A&O x4, ambulates, but is winded when ambulating. Pt is on her 1st unit of blood, has 4 total order, order to infuse over 4 hrs each. Please give Lasix 20 mg IV once after completion of 2 2 units of blood transfusion and before starting 3rd unit of blood transfusion. Per Dr. Orest Dikes order, VSS, mother at bedside, pt is really sweet.

## 2023-03-08 NOTE — Hospital Course (Signed)
37yo with h/o fibroids and anemia who presented on 9/30 for fatigue and SOB.  Prior admission in 10/2021 for symptomatic anemia requiring transfusion, patient discharged with Megace which was subsequently changed to Aygestin due to weight gain.  Hgb 3 on presentation, transfused 4 units PRBC.  GYN contacted by telephone and will arrange for ASAP clinic f/u.

## 2023-03-08 NOTE — Progress Notes (Signed)
Progress Note   Patient: Cheryl Crane NWG:[id number] DOB: May 31, 1986 DOA: 03/07/2023     1 DOS: the patient was seen and examined on 03/08/2023   Brief hospital course: 37yo with h/o fibroids and anemia who presented on 9/30 for fatigue and SOB.  Prior admission in 10/2021 for symptomatic anemia requiring transfusion, patient discharged with Megace which was subsequently changed to Aygestin due to weight gain.  Hgb 3 on presentation, transfused 4 units PRBC.  GYN contacted by telephone and will arrange for ASAP clinic f/u.      Assessment and Plan:  Symptomatic anemia -Patient with prior admissions for the same, most recently in 10/2021 -She has known uterine fibroids, last seen by Dr. 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  Given her very low starting Hgb, she may require even more units.   -She was unable to tolerate PO iron this AM; will give IV iron post-transfusion -Recheck CBC post-transfusion and again in AM  Abnormal uterine bleeding -Patient with known uterine fibroids -She was previously prescribed Megace and did not notice significant improvement but did have weight gain and bloating -Will give Megace again as a temporary solution  -Regardless of the Megace, the patient is extremely likely to need a procedure to improve or permanently cease the bleeding -She will need close outpatient f/u with plan for intervention soon in order to prevent the need for repeated transfusions; per admitter, Dr. Jolayne Panther will arrange for ASAP appointment after discharge.   Thrombocytosis Likely reactive Will follow  Obesity -Body mass  index is 36.84 kg/m..  -Weight loss should be encouraged -Outpatient PCP/bariatric medicine/bariatric surgery f/u encouraged     Consultants: GYN - telephone only   Procedures: Transfusion x 4 units PRBC   Antibiotics: None  30 Day Unplanned Readmission Risk Score    Flowsheet Row ED to Hosp-Admission (Current) from 03/07/2023 in [facility] 4TH FLOOR PROGRESSIVE CARE AND UROLOGY  30 Day Unplanned Readmission Risk Score (%) 7.78 Filed at 03/08/2023 1200       This score is the patient's risk of an unplanned readmission within 30 days of being discharged (0 -100%). The score is based on dignosis, age, lab data, medications, orders, and past utilization.   Low:  0-14.9   Medium: 15-21.9   High: 22-29.9   Extreme: 30 and above           Subjective: Feeling a little better today, continues to be tired.   Objective: Vitals:   03/08/23 1440 03/08/23 1457  BP: 135/76 130/78  Pulse: 73 70  Resp: 18 18  Temp: 98.6 F (37 C) 98.4 F (36.9 C)  SpO2: 98% 99%    Intake/Output Summary (Last 24 hours) at 03/08/2023 1510 Last data filed at 03/08/2023 1501 Gross per 24 hour  Intake 1260 ml  Output 200 ml  Net 1060 ml   Filed Weights   03/07/23 1800  Weight: 88.5 kg    Exam:  General:  Appears calm and comfortable and is in NAD, pale Eyes:   EOMI, normal lids, iris ENT:  grossly normal hearing, lips & tongue, mmm Neck:  no LAD, masses or thyromegaly Cardiovascular:  RRR, no m/r/g. No LE edema.  Respiratory:   CTA bilaterally with no wheezes/rales/rhonchi.  Normal respiratory effort. Abdomen:  soft, NT, ND Skin:  no rash or induration seen on limited exam Musculoskeletal:  grossly normal tone BUE/BLE, good ROM, no bony abnormality Psychiatric:  blunted mood and affect, speech fluent and appropriate, AOx3 Neurologic:  CN 2-12 grossly intact, moves all extremities in coordinated fashion  Data Reviewed: I have reviewed the patient's lab results since admission.   Pertinent labs for today include:  Hgb 3 MCV 54.6 Platelets 689     Family Communication: Significant other was present on both visits; mother and another family member were present later in the day  Disposition: Status is: Inpatient Remains inpatient appropriate because: severe anemia     Time spent: 50 minutes  Unresulted Labs (From admission, onward)     Start     Ordered   03/08/23 0741  CBC  Once,   R        03/08/23 0740   03/08/23 0500  HIV Antibody (routine testing w rflx)  (HIV Antibody (Routine testing w reflex) panel)  Once,   R        03/07/23 2352   03/08/23 0500  Comprehensive metabolic panel  Daily,   R     Comments: Please collect blood in pediatric tubes    03/08/23 0004   03/07/23 1802  CBC with Differential  Once,   STAT        03/07/23 1801             Author: Jonah Blue, MD 03/08/2023 3:10 PM  For on call review [email or web address].

## 2023-03-09 DIAGNOSIS — N939 Abnormal uterine and vaginal bleeding, unspecified: Secondary | ICD-10-CM

## 2023-03-09 DIAGNOSIS — D62 Acute posthemorrhagic anemia: Secondary | ICD-10-CM | POA: Insufficient documentation

## 2023-03-09 HISTORY — DX: Abnormal uterine and vaginal bleeding, unspecified: N93.9

## 2023-03-09 LAB — CBC WITH DIFFERENTIAL/PLATELET
Abs Immature Granulocytes: 0.04 10*3/uL (ref 0.00–0.07)
Basophils Absolute: 0 10*3/uL (ref 0.0–0.1)
Basophils Relative: 1 %
Eosinophils Absolute: 0.1 10*3/uL (ref 0.0–0.5)
Eosinophils Relative: 1 %
HCT: 24.9 % — ABNORMAL LOW (ref 36.0–46.0)
Hemoglobin: 7.2 g/dL — ABNORMAL LOW (ref 12.0–15.0)
Immature Granulocytes: 1 %
Lymphocytes Relative: 31 %
Lymphs Abs: 1.8 10*3/uL (ref 0.7–4.0)
MCH: 20.6 pg — ABNORMAL LOW (ref 26.0–34.0)
MCHC: 28.9 g/dL — ABNORMAL LOW (ref 30.0–36.0)
MCV: 71.3 fL — ABNORMAL LOW (ref 80.0–100.0)
Monocytes Absolute: 0.4 10*3/uL (ref 0.1–1.0)
Monocytes Relative: 8 %
Neutro Abs: 3.3 10*3/uL (ref 1.7–7.7)
Neutrophils Relative %: 58 %
Platelets: 536 10*3/uL — ABNORMAL HIGH (ref 150–400)
RBC: 3.49 MIL/uL — ABNORMAL LOW (ref 3.87–5.11)
WBC: 5.6 10*3/uL (ref 4.0–10.5)
nRBC: 1.1 % — ABNORMAL HIGH (ref 0.0–0.2)

## 2023-03-09 LAB — TYPE AND SCREEN
ABO/RH(D): A POS
Antibody Screen: NEGATIVE
Unit division: 0
Unit division: 0
Unit division: 0
Unit division: 0

## 2023-03-09 LAB — BPAM RBC
Blood Product Expiration Date: 202410302359
Blood Product Expiration Date: 202410302359
Blood Product Expiration Date: 202410302359
ISSUE DATE / TIME: 202409302333
ISSUE DATE / TIME: 202410010428
ISSUE DATE / TIME: 202410011004
ISSUE DATE / TIME: 202410011435
ISSUE DATE / TIME: 202410302359
Unit Type and Rh: 202410302359
Unit Type and Rh: 202410302359
Unit Type and Rh: 6200
Unit Type and Rh: 6200
Unit Type and Rh: 6200
Unit Type and Rh: 6200

## 2023-03-09 LAB — COMPREHENSIVE METABOLIC PANEL
ALT: 12 U/L (ref 0–44)
AST: 13 U/L — ABNORMAL LOW (ref 15–41)
Albumin: 3.7 g/dL (ref 3.5–5.0)
Alkaline Phosphatase: 114 U/L (ref 38–126)
Anion gap: 8 (ref 5–15)
BUN: 6 mg/dL (ref 6–20)
CO2: 22 mmol/L (ref 22–32)
Calcium: 8.4 mg/dL — ABNORMAL LOW (ref 8.9–10.3)
Chloride: 108 mmol/L (ref 98–111)
Creatinine, Ser: 0.43 mg/dL — ABNORMAL LOW (ref 0.44–1.00)
GFR, Estimated: >60 mL/min (ref >60)
Glucose, Bld: 98 mg/dL (ref 70–99)
Potassium: 3.7 mmol/L (ref 3.5–5.1)
Sodium: 138 mmol/L (ref 135–145)
Total Bilirubin: 1.4 mg/dL — ABNORMAL HIGH (ref 0.3–1.2)
Total Protein: 6.7 g/dL (ref 6.5–8.1)

## 2023-03-09 LAB — HEMOGLOBIN AND HEMATOCRIT, BLOOD
HCT: 26 % — ABNORMAL LOW (ref 36.0–46.0)
Hemoglobin: 7.6 g/dL — ABNORMAL LOW (ref 12.0–15.0)

## 2023-03-09 LAB — HIV ANTIBODY (ROUTINE TESTING W REFLEX): HIV Screen 4th Generation wRfx: NONREACTIVE

## 2023-03-09 MED ORDER — FERROUS SULFATE 325 (65 FE) MG PO TABS: 325.0000 mg | ORAL_TABLET | Freq: Two times a day (BID) | ORAL | 0 refills | Status: DC

## 2023-03-09 MED ORDER — MEGESTROL ACETATE 40 MG PO TABS: 40.0000 mg | ORAL_TABLET | Freq: Two times a day (BID) | ORAL | 0 refills | Status: AC

## 2023-03-09 NOTE — Progress Notes (Addendum)
       Overnight   NAME: Cheryl Crane MRN: [id number] DOB : Feb 20, 1986    Date of Service   03/09/2023   HPI/Events of Note    Notified by Attending for follow up  As of   2027 hrs BP      123/71 (87) HR      71 RR       19 SPO2  98%  Room Air Temp  97.72F 36.6C      Interventions/ Plan   Continue orders as previously written      Chinita Greenland BSN MSNA MSN ACNPC-AG Acute Care Nurse Practitioner [facility]

## 2023-03-09 NOTE — Discharge Summary (Signed)
Physician Discharge Summary  Cheryl Crane UJW:[id number] DOB: 01-15-1986 DOA: 03/07/2023  PCP: Pcp, No  Admit date: 03/07/2023 Discharge date: 03/09/2023  Admitted From: Home Disposition:  Home  Recommendations for Outpatient Follow-up:  Follow up with PCP in 1 week, Oct 7  Follow up with Ob/Gyn as soon as possible  Repeat CBC in 1 week to ensure stability of hemoglobin Repeat iron panel in 3-4 weeks   Discharge Condition: Stable CODE STATUS: Full  Diet recommendation: Regular   Brief/Interim Summary: Cheryl Crane is a 37yo with h/o fibroids and anemia who presented on 9/30 for fatigue and SOB.  Prior admission in 10/2021 for symptomatic anemia requiring transfusion, patient discharged with Megace which was subsequently changed to Aygestin due to weight gain.  Hgb 3 was on presentation, transfused 4 units PRBC with improvement of hemoglobin.  She also received IV Venofer for iron deficiency.  GYN contacted by telephone and will arrange for ASAP clinic f/u.  Patient was discharged in improved condition, with prescription for iron supplementation and Megace.   Discharge Diagnoses:   Principal Problem:   Symptomatic anemia Active Problems:   Fibroid uterus   Obesity (BMI 30-39.9)   History of menorrhagia   Thrombocytosis   Acute blood loss anemia (ABLA)   Abnormal uterine bleeding   Discharge Instructions  Discharge Instructions     Call MD for:  difficulty breathing, headache or visual disturbances   Complete by: As directed    Call MD for:  extreme fatigue   Complete by: As directed    Call MD for:  persistant dizziness or light-headedness   Complete by: As directed    Call MD for:  persistant nausea and vomiting   Complete by: As directed    Call MD for:  temperature >100.4   Complete by: As directed    Diet general   Complete by: As directed    Discharge instructions   Complete by: As directed    You were cared for by a hospitalist during your hospital stay. If  you have any questions about your discharge medications or the care you received while you were in the hospital after you are discharged, you can call the unit and ask to speak with the hospitalist on call if the hospitalist that took care of you is not available. Once you are discharged, your primary care physician will handle any further medical issues. Please note that NO REFILLS for any discharge medications will be authorized once you are discharged, as it is imperative that you return to your primary care physician (or establish a relationship with a primary care physician if you do not have one) for your aftercare needs so that they can reassess your need for medications and monitor your lab values.   Increase activity slowly   Complete by: As directed       Allergies as of 03/09/2023   No Known Allergies      Medication List     STOP taking these medications    docusate sodium 100 MG capsule Commonly known as: COLACE   naproxen 500 MG tablet Commonly known as: NAPROSYN   norethindrone 5 MG tablet Commonly known as: Aygestin   ondansetron 4 MG disintegrating tablet Commonly known as: ZOFRAN-ODT   senna-docusate 8.6-50 MG tablet Commonly known as: Senokot-S       TAKE these medications    acetaminophen 500 MG tablet Commonly known as: TYLENOL Take 1,000 mg by mouth every 6 (six) hours as needed for  mild pain.   albuterol 108 (90 Base) MCG/ACT inhaler Commonly known as: VENTOLIN HFA Inhale 2 puffs into the lungs every 6 (six) hours as needed for wheezing or shortness of breath.   ferrous sulfate 325 (65 FE) MG tablet Take 1 tablet (325 mg total) by mouth 2 (two) times daily with a meal.   megestrol 40 MG tablet Commonly known as: MEGACE Take 1 tablet (40 mg total) by mouth 2 (two) times daily for 7 days.        Follow-up Information     [facility] Follow up.   Specialty: Internal Medicine Why: Hospital Follow-Up Appointment:    Monday, March 14, 2023 at 2:20pm   Please call 24 hours in advance if you are unable to keep this appointment and/or need to reschedule. Contact information: [address] [place] [address] [phone]        Cheryl Bores, MD. Schedule an appointment as soon as possible for a visit.   Specialty: Obstetrics and Gynecology Contact information: [address] [place] [address] [phone]                No Known Allergies   Procedures/Studies: DG CHEST PORT 1 VIEW  Result Date: 03/08/2023 CLINICAL DATA:  Chest pain EXAM: PORTABLE CHEST 1 VIEW COMPARISON:  03/07/2023 FINDINGS: Cardiomegaly. No confluent airspace opacities, effusions or edema. No acute bony abnormality. IMPRESSION: Cardiomegaly.  No active disease. Electronically Signed   By: Charlett Nose M.D.   On: 03/08/2023 20:05   DG CHEST PORT 1 VIEW  Result Date: 03/08/2023 CLINICAL DATA:  Hyperglycemia and shortness of breath. EXAM: PORTABLE CHEST 1 VIEW COMPARISON:  June 07, 2015 FINDINGS: The cardiac silhouette is mildly enlarged. Mild linear scarring and/or atelectasis is seen within the mid left lung. No pleural effusion or pneumothorax is identified. The visualized skeletal structures are unremarkable. IMPRESSION: 1. Mild cardiomegaly with mild mid left lung linear scarring and/or atelectasis. Electronically Signed   By: Aram Candela M.D.   On: 03/08/2023 00:44      Discharge Exam: Vitals:   03/08/23 2027 03/09/23 0608  BP: 123/71 (!) 130/93  Pulse: 76 65  Resp: 20 17  Temp: 97.9 F (36.6 C) (!) 97.5 F (36.4 C)  SpO2: 98% 91%    General: Pt is alert, awake, not in acute distress Cardiovascular: RRR, S1/S2 +, no edema Respiratory: CTA bilaterally, no wheezing, no rhonchi, no respiratory distress, no conversational dyspnea  Abdominal: Soft, NT, ND, bowel sounds + Extremities: no edema, no cyanosis Psych: Normal mood and affect, stable judgement and  insight     The results of significant diagnostics from this hospitalization (including imaging, microbiology, ancillary and laboratory) are listed below for reference.     Microbiology: No results found for this or any previous visit (from the past 240 hour(s)).   Labs: BNP (last 3 results) No results for input(s): "BNP" in the last 8760 hours. Basic Metabolic Panel: Recent Labs  Lab 03/07/23 1808 03/08/23 2036 03/09/23 0344  NA 136 134* 138  K 3.8 3.6 3.7  CL 107 105 108  CO2 22 23 22   GLUCOSE 97 109* 98  BUN 5* <5* 6  CREATININE 0.53 0.53 0.43*  CALCIUM 8.3* 8.4* 8.4*   Liver Function Tests: Recent Labs  Lab 03/08/23 2036 03/09/23 0344  AST 16 13*  ALT 12 12  ALKPHOS 117 114  BILITOT 2.2* 1.4*  PROT 7.4 6.7  ALBUMIN 4.0 3.7  No results for input(s): "LIPASE", "AMYLASE" in the last 168 hours. No results for input(s): "AMMONIA" in the last 168 hours. CBC: Recent Labs  Lab 03/07/23 2058 03/07/23 2140 03/08/23 1804 03/08/23 2036 03/09/23 0344 03/09/23 0749  WBC 5.8  --   --  6.4 5.6  --   NEUTROABS 3.1  --   --   --  3.3  --   HGB 3.0* 3.1* 7.7* 7.4* 7.2* 7.6*  HCT 13.0* 13.8* 26.3* 25.2* 24.9* 26.0*  MCV 54.6*  --   --  69.8* 71.3*  --   PLT 689*  --   --  570* 536*  --    Cardiac Enzymes: No results for input(s): "CKTOTAL", "CKMB", "CKMBINDEX", "TROPONINI" in the last 168 hours. BNP: Invalid input(s): "POCBNP" CBG: Recent Labs  Lab 03/07/23 1757 03/08/23 1807  GLUCAP 92 86   D-Dimer No results for input(s): "DDIMER" in the last 72 hours. Hgb A1c No results for input(s): "HGBA1C" in the last 72 hours. Lipid Profile No results for input(s): "CHOL", "HDL", "LDLCALC", "TRIG", "CHOLHDL", "LDLDIRECT" in the last 72 hours. Thyroid function studies No results for input(s): "TSH", "T4TOTAL", "T3FREE", "THYROIDAB" in the last 72 hours.  Invalid input(s): "FREET3" Anemia work up Recent Labs    03/07/23 2140  VITAMINB12 602  FOLATE 10.0   FERRITIN 1*  TIBC 585*  IRON 11*  RETICCTPCT 1.4   Urinalysis    Component Value Date/Time   COLORURINE YELLOW 10/25/2021 2154   APPEARANCEUR CLEAR 10/25/2021 2154   LABSPEC 1.044 (H) 10/25/2021 2154   PHURINE 5.0 10/25/2021 2154   GLUCOSEU NEGATIVE 10/25/2021 2154   HGBUR MODERATE (A) 10/25/2021 2154   BILIRUBINUR NEGATIVE 10/25/2021 2154   KETONESUR NEGATIVE 10/25/2021 2154   PROTEINUR NEGATIVE 10/25/2021 2154   UROBILINOGEN 0.2 08/16/2012 1030   NITRITE NEGATIVE 10/25/2021 2154   LEUKOCYTESUR NEGATIVE 10/25/2021 2154   Sepsis Labs Recent Labs  Lab 03/07/23 2058 03/08/23 2036 03/09/23 0344  WBC 5.8 6.4 5.6   Microbiology No results found for this or any previous visit (from the past 240 hour(s)).   Patient was seen and examined on the day of discharge and was found to be in stable condition. Time coordinating discharge: 35 minutes including assessment and coordination of care, as well as examination of the patient.   SIGNED:  Noralee Stain, DO [facility] 03/09/2023, 3:10 PM

## 2023-03-14 ENCOUNTER — Encounter: Payer: Self-pay | Admitting: Nurse Practitioner

## 2023-03-14 ENCOUNTER — Ambulatory Visit (INDEPENDENT_AMBULATORY_CARE_PROVIDER_SITE_OTHER): Payer: Self-pay | Admitting: Nurse Practitioner

## 2023-03-14 VITALS — BP 131/57 | HR 69 | Wt 200.0 lb

## 2023-03-14 DIAGNOSIS — N939 Abnormal uterine and vaginal bleeding, unspecified: Secondary | ICD-10-CM

## 2023-03-14 DIAGNOSIS — R011 Cardiac murmur, unspecified: Secondary | ICD-10-CM | POA: Insufficient documentation

## 2023-03-14 DIAGNOSIS — D649 Anemia, unspecified: Secondary | ICD-10-CM

## 2023-03-14 DIAGNOSIS — Z09 Encounter for follow-up examination after completed treatment for conditions other than malignant neoplasm: Secondary | ICD-10-CM | POA: Insufficient documentation

## 2023-03-14 DIAGNOSIS — Z720 Tobacco use: Secondary | ICD-10-CM

## 2023-03-14 DIAGNOSIS — R208 Other disturbances of skin sensation: Secondary | ICD-10-CM | POA: Insufficient documentation

## 2023-03-14 NOTE — Assessment & Plan Note (Signed)
Hospital discharge summary, labs, recommendations were reviewed Patient encouraged to follow-up with OB/GYN as recommended Follow-up in the office in 4 weeks

## 2023-03-14 NOTE — Progress Notes (Signed)
New Patient Office Visit  Subjective:  Patient ID: Cheryl Crane, female    DOB: 08-Feb-1986  Age: 37 y.o. MRN: [id number]  CC:  Chief Complaint  Patient presents with   Hospitalization Follow-up   Establish Care    HPI Cheryl Crane is a 37 y.o. female  has a past medical history of Abnormal uterine bleeding (03/09/2023), Asthma, Asthma, chronic (06/08/2015), Bronchitis, Fibroid uterus (07/31/2012), Menorrhagia with regular cycle (12/10/2021), Obesity (BMI 30-39.9) (10/25/2021), Symptomatic anemia (06/08/2015), Thrombocytosis (03/08/2023), and Tobacco use (10/25/2021).  Patient presents for hospital discharge follow-up and to establish care for her chronic medical conditions.  Has no previous PCP  Patient was on admission at the hospital from 03/07/2023 to 03/09/2023 for symptomatic anemia.  Patient received 4 units PRBC when she was in the hospital she also received IV Venofer for iron deficiency, she was encouraged to follow-up with gynecologist in the office.  Patient discharged with prescription for iron supplementation and Megace.  Today patient denies malaise shortness of breath, chest pain, Stated that she had returned back to work.  She has not followed up with OB/GYN she was encouraged to call the OB/GYN office and schedule an appointment as soon as possible.  Patient is accompanied by her mother  Patient complains of soreness on her left arm from where she had IV placed while in the hospital.  Patient encouraged to take Tylenol and apply heat or ice as needed to help with soreness   Past Medical History:  Diagnosis Date   Abnormal uterine bleeding 03/09/2023   Asthma    Asthma, chronic 06/08/2015   Bronchitis    Fibroid uterus 07/31/2012   3 seen.. All about 6cm     Menorrhagia with regular cycle 12/10/2021   Obesity (BMI 30-39.9) 10/25/2021   Symptomatic anemia 06/08/2015   Thrombocytosis 03/08/2023   Tobacco use 10/25/2021    History reviewed. No pertinent  surgical history.  Family History  Problem Relation Age of Onset   Diabetes Mother    Breast cancer Mother    Fibroids Mother    Heart disease Father    Cancer - Lung Father    Fibroids Maternal Grandfather     Social History   Socioeconomic History   Marital status: Single    Spouse name: Not on file   Number of children: Not on file   Years of education: Not on file   Highest education level: Not on file  Occupational History   Not on file  Tobacco Use   Smoking status: Some Days    Current packs/day: 0.15    Types: Cigarettes   Smokeless tobacco: Not on file  Vaping Use   Vaping status: Never Used  Substance and Sexual Activity   Alcohol use: Yes    Comment: occasional   Drug use: No   Sexual activity: Yes  Other Topics Concern   Not on file  Social History Narrative   Lives with her grandmother    Social Determinants of Health   Financial Resource Strain: Not on file  Food Insecurity: No Food Insecurity (03/08/2023)   Hunger Vital Sign    Worried About Running Out of Food in the Last Year: Never true    Ran Out of Food in the Last Year: Never true  Recent Concern: Food Insecurity - Food Insecurity Present (03/08/2023)   Hunger Vital Sign    Worried About Running Out of Food in the Last Year: Sometimes true    Ran Out of Food  in the Last Year: Sometimes true  Transportation Needs: No Transportation Needs (03/08/2023)   PRAPARE - Administrator, Civil Service (Medical): No    Lack of Transportation (Non-Medical): No  Physical Activity: Not on file  Stress: Not on file  Social Connections: Not on file  Intimate Partner Violence: Not At Risk (03/08/2023)   Humiliation, Afraid, Rape, and Kick questionnaire    Fear of Current or Ex-Partner: No    Emotionally Abused: No    Physically Abused: No    Sexually Abused: No    ROS Review of Systems  Constitutional:  Negative for appetite change, chills, fatigue and fever.  HENT:  Negative for  congestion, postnasal drip, rhinorrhea and sneezing.   Respiratory:  Negative for cough, shortness of breath and wheezing.   Cardiovascular:  Negative for chest pain, palpitations and leg swelling.  Gastrointestinal:  Negative for abdominal pain, constipation, nausea and vomiting.  Genitourinary:  Negative for difficulty urinating, dysuria, flank pain and frequency.  Musculoskeletal:  Negative for arthralgias, back pain, joint swelling and myalgias.  Skin:  Negative for color change, pallor, rash and wound.  Neurological:  Negative for dizziness, facial asymmetry, weakness, numbness and headaches.  Psychiatric/Behavioral:  Negative for behavioral problems, confusion, self-injury and suicidal ideas.     Objective:   Today's Vitals: BP (!) 131/57   Pulse 69   Wt 200 lb (90.7 kg)   LMP  (LMP Unknown)   SpO2 100%   BMI 37.79 kg/m   Physical Exam Vitals and nursing note reviewed.  Constitutional:      General: She is not in acute distress.    Appearance: Normal appearance. She is obese. She is not ill-appearing, toxic-appearing or diaphoretic.  HENT:     Mouth/Throat:     Mouth: Mucous membranes are moist.     Pharynx: Oropharynx is clear. No oropharyngeal exudate or posterior oropharyngeal erythema.  Eyes:     General: No scleral icterus.       Right eye: No discharge.        Left eye: No discharge.     Extraocular Movements: Extraocular movements intact.     Conjunctiva/sclera: Conjunctivae normal.  Cardiovascular:     Rate and Rhythm: Normal rate and regular rhythm.     Pulses: Normal pulses.     Heart sounds: Murmur heard.     No friction rub. No gallop.  Pulmonary:     Effort: Pulmonary effort is normal. No respiratory distress.     Breath sounds: Normal breath sounds. No stridor. No wheezing, rhonchi or rales.  Chest:     Chest wall: No tenderness.  Abdominal:     General: There is no distension.     Palpations: Abdomen is soft.     Tenderness: There is no abdominal  tenderness. There is no right CVA tenderness, left CVA tenderness or guarding.  Musculoskeletal:        General: No swelling, tenderness, deformity or signs of injury.     Right lower leg: No edema.     Left lower leg: No edema.  Skin:    General: Skin is warm and dry.     Capillary Refill: Capillary refill takes less than 2 seconds.     Coloration: Skin is not jaundiced or pale.     Findings: No bruising, erythema or lesion.     Comments: Tenderness on palpation of left forearm, no redness ,swelling or discharge noted  Neurological:     Mental Status: She is  alert and oriented to person, place, and time.     Motor: No weakness.     Coordination: Coordination normal.     Gait: Gait normal.  Psychiatric:        Mood and Affect: Mood normal.        Behavior: Behavior normal.        Thought Content: Thought content normal.        Judgment: Judgment normal.     Assessment & Plan:   Problem List Items Addressed This Visit       Genitourinary   Abnormal uterine bleeding    Has known fibroids She is followed by OB/GYN Continue Megace taking 40 mg daily, she stated that she was given only 7 pills at the pharmacy. Continue ferrous sulfate 325 mg twice daily         Other   Symptomatic anemia - Primary    Continue ferrous sulfate 325 mg twice daily Encouraged to follow-up with gynecologist Checking CBC Follow-up in the office in 4 weeks  Lab Results  Component Value Date   WBC 5.6 03/09/2023   HGB 7.6 (L) 03/09/2023   HCT 26.0 (L) 03/09/2023   MCV 71.3 (L) 03/09/2023   PLT 536 (H) 03/09/2023         Relevant Orders   CBC   Tobacco use    A pack last 4-5 days . Smokes about  less than 0.5  Asked about quitting: confirms that he/she currently smokes cigarettes Advise to quit smoking: Educated about QUITTING to reduce the risk of cancer, cardio and cerebrovascular disease. Assess willingness: Unwilling to quit at this time, but is working on cutting back. Assist  with counseling and pharmacotherapy: Counseled for 5 minutes and literature provided. Arrange for follow up: follow up in 1 month and continue to offer help.       Heart murmur    She sometimes feels fatigued and has palpitations when she has history of anemia -No chest pain Patient referred to cardiology       Relevant Orders   Ambulatory referral to Cardiology   Skin soreness    Skin is warm and dry no redness or drainage noted Encouraged to apply heat or ice as needed for pain May take OTC Tylenol as needed      Hospital discharge follow-up    Hospital discharge summary, labs, recommendations were reviewed Patient encouraged to follow-up with OB/GYN as recommended Follow-up in the office in 4 weeks       Outpatient Encounter Medications as of 03/14/2023  Medication Sig   acetaminophen (TYLENOL) 500 MG tablet Take 1,000 mg by mouth every 6 (six) hours as needed for mild pain.   albuterol (VENTOLIN HFA) 108 (90 Base) MCG/ACT inhaler Inhale 2 puffs into the lungs every 6 (six) hours as needed for wheezing or shortness of breath.   ferrous sulfate 325 (65 FE) MG tablet Take 1 tablet (325 mg total) by mouth 2 (two) times daily with a meal.   megestrol (MEGACE) 40 MG tablet Take 1 tablet (40 mg total) by mouth 2 (two) times daily for 7 days.   No facility-administered encounter medications on file as of 03/14/2023.    Follow-up: Return in about 4 weeks (around 04/11/2023) for anemia.   Donell Beers, FNP

## 2023-03-14 NOTE — Assessment & Plan Note (Addendum)
Continue ferrous sulfate 325 mg twice daily Encouraged to follow-up with gynecologist Checking CBC Follow-up in the office in 4 weeks  Lab Results  Component Value Date   WBC 5.6 03/09/2023   HGB 7.6 (L) 03/09/2023   HCT 26.0 (L) 03/09/2023   MCV 71.3 (L) 03/09/2023   PLT 536 (H) 03/09/2023

## 2023-03-14 NOTE — Assessment & Plan Note (Signed)
She sometimes feels fatigued and has palpitations when she has history of anemia -No chest pain Patient referred to cardiology

## 2023-03-14 NOTE — Progress Notes (Signed)
5 

## 2023-03-14 NOTE — Assessment & Plan Note (Addendum)
A pack last 4-5 days . Smokes about  less than 0.5  Asked about quitting: confirms that he/she currently smokes cigarettes Advise to quit smoking: Educated about QUITTING to reduce the risk of cancer, cardio and cerebrovascular disease. Assess willingness: Unwilling to quit at this time, but is working on cutting back. Assist with counseling and pharmacotherapy: Counseled for 5 minutes and literature provided. Arrange for follow up: follow up in 1 month and continue to offer help.

## 2023-03-14 NOTE — Assessment & Plan Note (Signed)
Skin is warm and dry no redness or drainage noted Encouraged to apply heat or ice as needed for pain May take OTC Tylenol as needed

## 2023-03-14 NOTE — Patient Instructions (Addendum)
Please apply heat or ice to the affected sore area on your arm to help relieve breast soreness.  You can also take Tylenol 650 mg every 6 hours as needed  Please follow-up with a gynecologist as discussed  Consider getting your flu vaccine and Tdap vaccine   It is important that you exercise regularly at least 30 minutes 5 times a week as tolerated  Think about what you will eat, plan ahead. Choose " clean, green, fresh or frozen" over canned, processed or packaged foods which are more sugary, salty and fatty. 70 to 75% of food eaten should be vegetables and fruit. Three meals at set times with snacks allowed between meals, but they must be fruit or vegetables. Aim to eat over a 12 hour period , example 7 am to 7 pm, and STOP after  your last meal of the day. Drink water,generally about 64 ounces per day, no other drink is as healthy. Fruit juice is best enjoyed in a healthy way, by EATING the fruit.  Thanks for choosing [facility] we consider it a privelige to serve you.

## 2023-03-14 NOTE — Assessment & Plan Note (Signed)
Has known fibroids She is followed by OB/GYN Continue Megace taking 40 mg daily, she stated that she was given only 7 pills at the pharmacy. Continue ferrous sulfate 325 mg twice daily

## 2023-03-15 ENCOUNTER — Telehealth: Payer: Self-pay | Admitting: Family Medicine

## 2023-03-15 LAB — CBC
Hematocrit: 37.6 % (ref 34.0–46.6)
Hemoglobin: 11.5 g/dL (ref 11.1–15.9)
MCH: 28.3 pg (ref 26.6–33.0)
MCHC: 30.6 g/dL — ABNORMAL LOW (ref 31.5–35.7)
MCV: 92 fL (ref 79–97)
Platelets: 299 10*3/uL (ref 150–450)
RBC: 4.07 x10E6/uL (ref 3.77–5.28)
RDW: 13.6 % (ref 11.7–15.4)
WBC: 5.8 10*3/uL (ref 3.4–10.8)

## 2023-03-15 NOTE — Telephone Encounter (Signed)
Called pt and left appointment details with a call back number.

## 2023-03-16 ENCOUNTER — Telehealth: Payer: Self-pay

## 2023-03-16 NOTE — Telephone Encounter (Signed)
-----   Message from [employer] sent at 03/15/2023  8:11 AM EDT ----- Your hemoglobin level is within normal range.  Please continue your iron supplement and follow-up with the OB/GYN as planned

## 2023-03-16 NOTE — Telephone Encounter (Signed)
Left message for patient to call back regarding her results and recommendations.

## 2023-03-18 ENCOUNTER — Ambulatory Visit: Payer: Self-pay | Admitting: Obstetrics and Gynecology

## 2023-03-22 ENCOUNTER — Ambulatory Visit (INDEPENDENT_AMBULATORY_CARE_PROVIDER_SITE_OTHER): Payer: Self-pay | Admitting: Obstetrics and Gynecology

## 2023-03-22 ENCOUNTER — Encounter: Payer: Self-pay | Admitting: Obstetrics and Gynecology

## 2023-03-22 ENCOUNTER — Other Ambulatory Visit: Payer: Self-pay

## 2023-03-22 VITALS — BP 123/77 | HR 116 | Wt 199.0 lb

## 2023-03-22 DIAGNOSIS — D259 Leiomyoma of uterus, unspecified: Secondary | ICD-10-CM

## 2023-03-22 DIAGNOSIS — N920 Excessive and frequent menstruation with regular cycle: Secondary | ICD-10-CM

## 2023-03-22 DIAGNOSIS — D649 Anemia, unspecified: Secondary | ICD-10-CM

## 2023-03-22 MED ORDER — TRANEXAMIC ACID 650 MG PO TABS: 1300.0000 mg | ORAL_TABLET | Freq: Three times a day (TID) | ORAL | 2 refills | Status: DC

## 2023-03-22 NOTE — Progress Notes (Signed)
GYNECOLOGY OFFICE VISIT NOTE  History:   Cheryl Crane is a 37 y.o. G2P0020 here today for fibroids and HVB causing symptomatic, iron def anemia.   Discussed the use of AI scribe software for clinical note transcription with the patient, who gave verbal consent to proceed.  History of Present Illness   The patient, with a history of fibroids, presents for a follow-up visit after a recent hospitalization due to heavy menstrual bleeding. The patient reports having had these fibroids for a long time and has experienced varying degrees of menstrual bleeding, from normal to extremely heavy. The most recent episode of heavy bleeding was severe enough to warrant a visit to the ER and the patient received four units of blood transfusion.  The patient is uncertain about their desire for future childbearing, expressing concerns about potential risks due to age. They also report feeling bloated when taking Megace, a medication previously prescribed for heavy menstrual bleeding. The patient drives school buses for a living and is concerned about potential side effects of medications that could affect their ability to work such as nausea.   The patient is also dealing with insurance issues and is in the process of applying for financial assistance. They express a desire to have the fibroids treated and are considering different treatment options, including preserving fertility for potential future pregnancy.       I personally reviewed the images from her CT in 2023 which shows multiple large fibroids that extend to about 2 cm above her umbilicus.  I personally reviewed the following labs: CBC: 11.4 most recent.  In ED nadir was at 3.1.     Past Medical History:  Diagnosis Date   Abnormal uterine bleeding 03/09/2023   Asthma    Asthma, chronic 06/08/2015   Bronchitis    Fibroid uterus 07/31/2012   3 seen.. All about 6cm     Menorrhagia with regular cycle 12/10/2021   Obesity (BMI 30-39.9)  10/25/2021   Symptomatic anemia 06/08/2015   Thrombocytosis 03/08/2023   Tobacco use 10/25/2021    No past surgical history on file.  The following portions of the patient's history were reviewed and updated as appropriate: allergies, current medications, past family history, past medical history, past social history, past surgical history and problem list.   Health Maintenance:    Review of Systems:  Pertinent items noted in HPI and remainder of comprehensive ROS otherwise negative.  Physical Exam:  BP 123/77   Pulse (!) 116   Wt 199 lb (90.3 kg)   LMP 02/25/2023   BMI 37.60 kg/m  CONSTITUTIONAL: Well-developed, well-nourished female in no acute distress.  HEENT:  Normocephalic, atraumatic. External right and left ear normal. No scleral icterus.  NECK: Normal range of motion, supple, no masses noted on observation SKIN: No rash noted. Not diaphoretic. No erythema. No pallor. MUSCULOSKELETAL: Normal range of motion. No edema noted. NEUROLOGIC: Alert and oriented to person, place, and time. Normal muscle tone coordination. No cranial nerve deficit noted. PSYCHIATRIC: Normal mood and affect. Normal behavior. Normal judgment and thought content.  PELVIC: Deferred  Labs and Imaging No results found for this or any previous visit (from the past 168 hour(s)). DG CHEST PORT 1 VIEW  Result Date: 03/08/2023 CLINICAL DATA:  Chest pain EXAM: PORTABLE CHEST 1 VIEW COMPARISON:  03/07/2023 FINDINGS: Cardiomegaly. No confluent airspace opacities, effusions or edema. No acute bony abnormality. IMPRESSION: Cardiomegaly.  No active disease. Electronically Signed   By: Charlett Nose M.D.   On: 03/08/2023  20:05   DG CHEST PORT 1 VIEW  Result Date: 03/08/2023 CLINICAL DATA:  Hyperglycemia and shortness of breath. EXAM: PORTABLE CHEST 1 VIEW COMPARISON:  June 07, 2015 FINDINGS: The cardiac silhouette is mildly enlarged. Mild linear scarring and/or atelectasis is seen within the mid left lung. No  pleural effusion or pneumothorax is identified. The visualized skeletal structures are unremarkable. IMPRESSION: 1. Mild cardiomegaly with mild mid left lung linear scarring and/or atelectasis. Electronically Signed   By: Aram Candela M.D.   On: 03/08/2023 00:44    Assessment and Plan:   1. Uterine leiomyoma, unspecified location Large fibroids causing significant bleeding, requiring multiple blood transfusions. Patient is uncertain about future childbearing. Discussed various treatment options including myomectomy, uterine artery embolization, and hysterectomy, each with their own risks and benefits. - Prescribe tranexamic acid for the five heaviest days of the menstrual cycle to reduce bleeding. - Consider myomectomy if patient decides to preserve fertility. - Consider uterine artery embolization or hysterectomy if patient decides against future childbearing. - Follow-up in one month to discuss treatment options once insurance or financial assistance is secured. - In the interim, patient advised to go to the emergency room if bleeding becomes unmanageable.  2. Symptomatic anemia  - tranexamic acid (LYSTEDA) 650 MG TABS tablet; Take 2 tablets (1,300 mg total) by mouth 3 (three) times daily. Take during menses for a maximum of five days  Dispense: 30 tablet; Refill: 2  3. Menorrhagia with regular cycle - [facility] appt made for 10/29.  - Needs EMB - tranexamic acid (LYSTEDA) 650 MG TABS tablet; Take 2 tablets (1,300 mg total) by mouth 3 (three) times daily. Take during menses for a maximum of five days  Dispense: 30 tablet; Refill: 2        Meds ordered this encounter  Medications   tranexamic acid (LYSTEDA) 650 MG TABS tablet    Sig: Take 2 tablets (1,300 mg total) by mouth 3 (three) times daily. Take during menses for a maximum of five days    Dispense:  30 tablet    Refill:  2     Routine preventative health maintenance measures emphasized. Please refer to After Visit Summary for  other counseling recommendations.   Return in about 1 month (around 04/22/2023) for Follow up with [facility] please.  Milas Hock, MD, FACOG Obstetrician & Gynecologist, [facility] for [facility], [facility]

## 2023-04-05 ENCOUNTER — Other Ambulatory Visit: Payer: Self-pay

## 2023-04-11 ENCOUNTER — Ambulatory Visit: Payer: Self-pay | Admitting: Nurse Practitioner

## 2023-04-15 ENCOUNTER — Ambulatory Visit: Payer: Self-pay | Admitting: Nurse Practitioner

## 2023-04-19 NOTE — Telephone Encounter (Signed)
Done Kh 

## 2023-04-25 ENCOUNTER — Other Ambulatory Visit (HOSPITAL_COMMUNITY)
Admission: RE | Admit: 2023-04-25 | Discharge: 2023-04-25 | Disposition: A | Discharge status: Home / Self Care | Payer: MEDICAID | Source: Ambulatory Visit | Attending: Obstetrics and Gynecology | Admitting: Obstetrics and Gynecology

## 2023-04-25 ENCOUNTER — Encounter: Payer: Self-pay | Admitting: Obstetrics and Gynecology

## 2023-04-25 ENCOUNTER — Ambulatory Visit (INDEPENDENT_AMBULATORY_CARE_PROVIDER_SITE_OTHER): Payer: Self-pay | Admitting: Obstetrics and Gynecology

## 2023-04-25 ENCOUNTER — Other Ambulatory Visit: Payer: Self-pay

## 2023-04-25 VITALS — BP 138/89 | HR 78 | Wt 213.2 lb

## 2023-04-25 DIAGNOSIS — D649 Anemia, unspecified: Secondary | ICD-10-CM

## 2023-04-25 DIAGNOSIS — B9689 Other specified bacterial agents as the cause of diseases classified elsewhere: Secondary | ICD-10-CM

## 2023-04-25 DIAGNOSIS — N920 Excessive and frequent menstruation with regular cycle: Secondary | ICD-10-CM | POA: Insufficient documentation

## 2023-04-25 DIAGNOSIS — N760 Acute vaginitis: Secondary | ICD-10-CM

## 2023-04-25 DIAGNOSIS — Z3202 Encounter for pregnancy test, result negative: Secondary | ICD-10-CM

## 2023-04-25 DIAGNOSIS — D259 Leiomyoma of uterus, unspecified: Secondary | ICD-10-CM

## 2023-04-25 LAB — POCT PREGNANCY, URINE: Preg Test, Ur: NEGATIVE

## 2023-04-25 MED ORDER — IBUPROFEN 800 MG PO TABS
800.0000 mg | ORAL_TABLET | Freq: Once | ORAL | Status: AC
Start: 2023-04-25 — End: 2023-04-25
  Administered 2023-04-25: 800 mg via ORAL

## 2023-04-25 MED ORDER — IBUPROFEN 600 MG PO TABS
600.0000 mg | ORAL_TABLET | Freq: Once | ORAL | Status: DC
Start: 2023-04-25 — End: 2023-04-25

## 2023-04-25 MED ORDER — TRANEXAMIC ACID 650 MG PO TABS
1300.0000 mg | ORAL_TABLET | Freq: Three times a day (TID) | ORAL | 2 refills | Status: DC
Start: 2023-04-25 — End: 2023-07-29

## 2023-04-25 MED ORDER — FERROUS SULFATE 325 (65 FE) MG PO TABS: 325.0000 mg | ORAL_TABLET | Freq: Two times a day (BID) | ORAL | 0 refills | Status: DC

## 2023-04-25 NOTE — Progress Notes (Signed)
GYNECOLOGY OFFICE VISIT NOTE  History:   Cheryl Crane is a 37 y.o. G2P0020 here today for follow up for fibroids and associated bleeding.   Since our last visit she has tried the TXA and it has helped signficantly per her report.   Last visit she did not yet have insurance but now has it. Will do pap and EMB today.      Past Medical History:  Diagnosis Date   Abnormal uterine bleeding 03/09/2023   Asthma    Asthma, chronic 06/08/2015   Bronchitis    Fibroid uterus 07/31/2012   3 seen.. All about 6cm     Menorrhagia with regular cycle 12/10/2021   Obesity (BMI 30-39.9) 10/25/2021   Symptomatic anemia 06/08/2015   Thrombocytosis 03/08/2023   Tobacco use 10/25/2021    History reviewed. No pertinent surgical history.  The following portions of the patient's history were reviewed and updated as appropriate: allergies, current medications, past family history, past medical history, past social history, past surgical history and problem list.    Review of Systems:  Pertinent items noted in HPI and remainder of comprehensive ROS otherwise negative.  Physical Exam:  BP 138/89   Pulse 78   Wt 213 lb 3.2 oz (96.7 kg)   BMI 40.28 kg/m  CONSTITUTIONAL: Well-developed, well-nourished female in no acute distress.  HEENT:  Normocephalic, atraumatic. External right and left ear normal. No scleral icterus.  NECK: Normal range of motion, supple, no masses noted on observation SKIN: No rash noted. Not diaphoretic. No erythema. No pallor. MUSCULOSKELETAL: Normal range of motion. No edema noted. NEUROLOGIC: Alert and oriented to person, place, and time. Normal muscle tone coordination. No cranial nerve deficit noted. PSYCHIATRIC: Normal mood and affect. Normal behavior. Normal judgment and thought content.  PELVIC: Normal appearing external genitalia; normal urethral meatus; normal appearing vaginal mucosa and cervix.  No abnormal discharge noted.  Normal uterine size, no other  palpable masses, no uterine or adnexal tenderness. Performed in the presence of a chaperone     GYNECOLOGY OFFICE PROCEDURE NOTE   Cheryl Crane is a 37 y.o. G2P0020 here for endometrial biopsy for AUB.    ENDOMETRIAL BIOPSY     The indications for endometrial biopsy were reviewed.   Risks of the biopsy including cramping, bleeding, infection, uterine perforation, inadequate specimen and need for additional procedures were discussed. Offered alternative of hysteroscopy, dilation and curettage in OR. The patient states she understands the R/B/I/A and agrees to undergo procedure today. Urine pregnancy test was Negative. Consent was signed. Time out was performed.    Patient was positioned in dorsal lithotomy position. A vaginal speculum was placed.  The cervix was visualized and was prepped with Betadine.  A single-toothed tenaculum was placed on the anterior lip of the cervix to stabilize it. The 3 mm pipelle was easily introduced into the endometrial cavity without difficulty to a depth of 7 cm, and a Scant amount of tissue was obtained after two passes and sent to pathology. The instruments were removed from the patient's vagina. Minimal bleeding from the cervix was noted. The patient tolerated the procedure well.   Patient was given post procedure instructions.  Will follow up pathology and manage accordingly; patient will be contacted with results and recommendations.  Routine preventative health maintenance measures emphasized.  Assessment and Plan:   1. Uterine leiomyoma, unspecified location Large fibroids causing significant bleeding, requiring multiple blood transfusions. Patient is uncertain about future childbearing.  - EMB and pap today -  Continue TXA  - Previously discussed options. Would continue TXA until doesn't work or she decides on another issue.   2. Menorrhagia with regular cycle EMB done  3. Symptomatic anemia Last CBC wnl.   Orders for this visit: -     Cytology -  PAP -     Surgical pathology   Routine preventative health maintenance measures emphasized. Please refer to After Visit Summary for other counseling recommendations.   Return in about 3 months (around 07/26/2023) for Follow up with Dr. Para March.  Milas Hock, MD, FACOG Obstetrician & Gynecologist, [facility] for [facility], [facility]

## 2023-04-25 NOTE — Addendum Note (Signed)
Addended by: Kathee Delton on: 04/25/2023 12:22 PM   Modules accepted: Orders

## 2023-04-27 ENCOUNTER — Telehealth: Payer: Self-pay | Admitting: Lactation Services

## 2023-04-27 LAB — CYTOLOGY - PAP
Adequacy: ABSENT
Comment: NEGATIVE
Diagnosis: NEGATIVE
High risk HPV: NEGATIVE

## 2023-04-27 LAB — SURGICAL PATHOLOGY

## 2023-04-27 MED ORDER — METRONIDAZOLE 500 MG PO TABS
500.0000 mg | ORAL_TABLET | Freq: Two times a day (BID) | ORAL | 0 refills | Status: DC
Start: 2023-04-27 — End: 2023-10-18

## 2023-04-27 NOTE — Telephone Encounter (Signed)
Called patient to inform her that EMB insufficient and needs repeat. She did not answer. LM for her to call the office at [phone] for recommendations from provider. Will need to have repeat EMB scheduled.

## 2023-04-27 NOTE — Telephone Encounter (Signed)
-----   Message from Milas Hock sent at 04/27/2023 12:37 PM EST ----- EMB was unfortunately insufficient. I would recommend repeating once more in the office and trying again. Please inform pt and have her scheduled.  Thanks, pad

## 2023-04-27 NOTE — Addendum Note (Signed)
Addended by: Milas Hock A on: 04/27/2023 01:58 PM   Modules accepted: Orders

## 2023-04-29 ENCOUNTER — Encounter: Payer: Self-pay | Admitting: Nurse Practitioner

## 2023-04-29 ENCOUNTER — Ambulatory Visit (INDEPENDENT_AMBULATORY_CARE_PROVIDER_SITE_OTHER): Payer: Self-pay | Admitting: Nurse Practitioner

## 2023-04-29 VITALS — BP 130/67 | HR 76 | Temp 97.5°F | Wt 209.0 lb

## 2023-04-29 DIAGNOSIS — Z720 Tobacco use: Secondary | ICD-10-CM | POA: Diagnosis not present

## 2023-04-29 DIAGNOSIS — N760 Acute vaginitis: Secondary | ICD-10-CM | POA: Insufficient documentation

## 2023-04-29 DIAGNOSIS — B9689 Other specified bacterial agents as the cause of diseases classified elsewhere: Secondary | ICD-10-CM | POA: Diagnosis not present

## 2023-04-29 DIAGNOSIS — D649 Anemia, unspecified: Secondary | ICD-10-CM | POA: Diagnosis not present

## 2023-04-29 DIAGNOSIS — R051 Acute cough: Secondary | ICD-10-CM | POA: Insufficient documentation

## 2023-04-29 MED ORDER — PROMETHAZINE-DM 6.25-15 MG/5ML PO SYRP: 5.0000 mL | ORAL_SOLUTION | Freq: Four times a day (QID) | ORAL | 0 refills | Status: DC | PRN

## 2023-04-29 MED ORDER — BENZONATATE 100 MG PO CAPS
100.0000 mg | ORAL_CAPSULE | Freq: Three times a day (TID) | ORAL | 0 refills | Status: DC | PRN
Start: 2023-04-29 — End: 2023-07-29

## 2023-04-29 NOTE — Progress Notes (Signed)
Established Patient Office Visit  Subjective:  Patient ID: Cheryl Crane, female    DOB: [birth year]  Age: 37 y.o. MRN: [id number]  CC:  Chief Complaint  Patient presents with   Hypertension    HPI Cheryl Crane is a 37 y.o. female  has a past medical history of Abnormal uterine bleeding (03/09/2023), Asthma, Asthma, chronic (06/08/2015), Bronchitis, Fibroid uterus (07/31/2012), Menorrhagia with regular cycle (12/10/2021), Obesity (BMI 30-39.9) (10/25/2021), Symptomatic anemia (06/08/2015), Thrombocytosis (03/08/2023), and Tobacco use (10/25/2021).  Patient presents for follow-up for anemia.  She has since established care with OB/GYN, recently had a normal Pap smear.  She was prescribed Flagyl for BV which she has not picked up from the pharmacy. she denies abnormal vaginal bleeding since her last visit.  Gynecologist recommended continuing TXA until it does not work.  she has an upcoming appointment to repeat endometrial biopsy. Taking ferrous sulfate 325 mg twice daily.  Patient complains of chest congestion for about a week.  She denies fever, chills, wheezing, shortness of breath.  Has cut down to 3 cigarettes daily   Past Medical History:  Diagnosis Date   Abnormal uterine bleeding 03/09/2023   Asthma    Asthma, chronic 06/08/2015   Bronchitis    Fibroid uterus 07/31/2012   3 seen.. All about 6cm     Menorrhagia with regular cycle 12/10/2021   Obesity (BMI 30-39.9) 10/25/2021   Symptomatic anemia 06/08/2015   Thrombocytosis 03/08/2023   Tobacco use 10/25/2021    History reviewed. No pertinent surgical history.  Family History  Problem Relation Age of Onset   Diabetes Mother    Breast cancer Mother    Fibroids Mother    Heart disease Father    Cancer - Lung Father    Fibroids Maternal Grandfather     Social History   Socioeconomic History   Marital status: Single    Spouse name: Not on file   Number of children: Not on file   Years of education: Not on  file   Highest education level: Not on file  Occupational History   Not on file  Tobacco Use   Smoking status: Some Days    Current packs/day: 0.15    Types: Cigarettes   Smokeless tobacco: Not on file  Vaping Use   Vaping status: Never Used  Substance and Sexual Activity   Alcohol use: Yes    Comment: occasional   Drug use: No   Sexual activity: Yes  Other Topics Concern   Not on file  Social History Narrative   Lives with her grandmother    Social Determinants of Health   Financial Resource Strain: Not on file  Food Insecurity: No Food Insecurity (03/22/2023)   Hunger Vital Sign    Worried About Running Out of Food in the Last Year: Never true    Ran Out of Food in the Last Year: Never true  Recent Concern: Food Insecurity - Food Insecurity Present (03/08/2023)   Hunger Vital Sign    Worried About Running Out of Food in the Last Year: Sometimes true    Ran Out of Food in the Last Year: Sometimes true  Transportation Needs: No Transportation Needs (03/22/2023)   PRAPARE - Administrator, Civil Service (Medical): No    Lack of Transportation (Non-Medical): No  Physical Activity: Not on file  Stress: Not on file  Social Connections: Not on file  Intimate Partner Violence: Not At Risk (03/08/2023)   Humiliation, Afraid, Rape, and Kick  questionnaire    Fear of Current or Ex-Partner: No    Emotionally Abused: No    Physically Abused: No    Sexually Abused: No    Outpatient Medications Prior to Visit  Medication Sig Dispense Refill   acetaminophen (TYLENOL) 500 MG tablet Take 1,000 mg by mouth every 6 (six) hours as needed for mild pain.     albuterol (VENTOLIN HFA) 108 (90 Base) MCG/ACT inhaler Inhale 2 puffs into the lungs every 6 (six) hours as needed for wheezing or shortness of breath.     ferrous sulfate 325 (65 FE) MG tablet Take 1 tablet (325 mg total) by mouth 2 (two) times daily with a meal. 60 tablet 0   metroNIDAZOLE (FLAGYL) 500 MG tablet Take 1  tablet (500 mg total) by mouth 2 (two) times daily. (Patient not taking: Reported on 04/29/2023) 14 tablet 0   tranexamic acid (LYSTEDA) 650 MG TABS tablet Take 2 tablets (1,300 mg total) by mouth 3 (three) times daily. Take during menses for a maximum of five days (Patient not taking: Reported on 04/29/2023) 30 tablet 2   No facility-administered medications prior to visit.    No Known Allergies  ROS Review of Systems  Constitutional:  Negative for appetite change, chills, fatigue and fever.  HENT:  Positive for congestion. Negative for postnasal drip, rhinorrhea and sneezing.   Respiratory:  Negative for cough, shortness of breath and wheezing.   Cardiovascular:  Negative for chest pain, palpitations and leg swelling.  Gastrointestinal:  Negative for abdominal pain, constipation, nausea and vomiting.  Genitourinary:  Negative for difficulty urinating, dysuria, flank pain and frequency.  Musculoskeletal:  Negative for arthralgias, back pain, joint swelling and myalgias.  Skin:  Negative for color change, pallor, rash and wound.  Neurological:  Negative for dizziness, facial asymmetry, weakness, numbness and headaches.  Psychiatric/Behavioral:  Negative for behavioral problems, confusion, self-injury and suicidal ideas.       Objective:    Physical Exam Vitals and nursing note reviewed.  Constitutional:      General: She is not in acute distress.    Appearance: Normal appearance. She is obese. She is not ill-appearing, toxic-appearing or diaphoretic.  HENT:     Mouth/Throat:     Mouth: Mucous membranes are moist.     Pharynx: Oropharynx is clear. No oropharyngeal exudate or posterior oropharyngeal erythema.  Eyes:     General: No scleral icterus.       Right eye: No discharge.        Left eye: No discharge.     Extraocular Movements: Extraocular movements intact.     Conjunctiva/sclera: Conjunctivae normal.  Cardiovascular:     Rate and Rhythm: Normal rate and regular rhythm.      Pulses: Normal pulses.     Heart sounds: Normal heart sounds. No murmur heard.    No friction rub. No gallop.  Pulmonary:     Effort: Pulmonary effort is normal. No respiratory distress.     Breath sounds: Normal breath sounds. No stridor. No wheezing, rhonchi or rales.  Chest:     Chest wall: No tenderness.  Abdominal:     General: There is no distension.     Palpations: Abdomen is soft.     Tenderness: There is no abdominal tenderness. There is no right CVA tenderness, left CVA tenderness or guarding.  Musculoskeletal:        General: No swelling, tenderness, deformity or signs of injury.     Right lower leg: No edema.  Left lower leg: No edema.  Skin:    General: Skin is warm and dry.     Capillary Refill: Capillary refill takes less than 2 seconds.     Coloration: Skin is not jaundiced or pale.     Findings: No bruising, erythema or lesion.  Neurological:     Mental Status: She is alert and oriented to person, place, and time.     Motor: No weakness.     Coordination: Coordination normal.     Gait: Gait normal.  Psychiatric:        Mood and Affect: Mood normal.        Behavior: Behavior normal.        Thought Content: Thought content normal.        Judgment: Judgment normal.     BP 130/67   Pulse 76   Temp (!) 97.5 F (36.4 C)   Wt 209 lb (94.8 kg)   SpO2 96%   BMI 39.49 kg/m  Wt Readings from Last 3 Encounters:  04/29/23 209 lb (94.8 kg)  04/25/23 213 lb 3.2 oz (96.7 kg)  03/22/23 199 lb (90.3 kg)    Lab Results  Component Value Date   TSH 4.691 (H) 10/26/2021   Lab Results  Component Value Date   WBC 5.8 03/14/2023   HGB 11.5 03/14/2023   HCT 37.6 03/14/2023   MCV 92 03/14/2023   PLT 299 03/14/2023   Lab Results  Component Value Date   NA 138 03/09/2023   K 3.7 03/09/2023   CO2 22 03/09/2023   GLUCOSE 98 03/09/2023   BUN 6 03/09/2023   CREATININE 0.43 (L) 03/09/2023   BILITOT 1.4 (H) 03/09/2023   ALKPHOS 114 03/09/2023   AST 13 (L)  03/09/2023   ALT 12 03/09/2023   PROT 6.7 03/09/2023   ALBUMIN 3.7 03/09/2023   CALCIUM 8.4 (L) 03/09/2023   ANIONGAP 8 03/09/2023   No results found for: "CHOL" No results found for: "HDL" No results found for: "LDLCALC" No results found for: "TRIG" No results found for: "CHOLHDL" No results found for: "HGBA1C"    Assessment & Plan:   Problem List Items Addressed This Visit       Genitourinary   BV (bacterial vaginosis)    Patient encouraged to pick up metronidazole 500 mg tablet that was ordered by the gynecologist and take as ordered        Other   Symptomatic anemia    Lab Results  Component Value Date   WBC 5.8 03/14/2023   HGB 11.5 03/14/2023   HCT 37.6 03/14/2023   MCV 92 03/14/2023   PLT 299 03/14/2023   Lab Results  Component Value Date   IRON 11 (L) 03/07/2023   TIBC 585 (H) 03/07/2023   FERRITIN 1 (L) 03/07/2023    Continue ferrous sulfate 325 mg twice daily Follow-up in 3 months      Tobacco use    Currently smokes about 3 cigarettes daily Patient encouraged to continue to intensify efforts at smoking cessation      Acute cough - Primary     - benzonatate (TESSALON PERLES) 100 MG capsule; Take 1 capsule (100 mg total) by mouth 3 (three) times daily as needed for cough.  Dispense: 20 capsule; Refill: 0       Relevant Medications   benzonatate (TESSALON PERLES) 100 MG capsule    Meds ordered this encounter  Medications   DISCONTD: promethazine-dextromethorphan (PROMETHAZINE-DM) 6.25-15 MG/5ML syrup    Sig: Take 5  mLs by mouth 4 (four) times daily as needed.    Dispense:  118 mL    Refill:  0   benzonatate (TESSALON PERLES) 100 MG capsule    Sig: Take 1 capsule (100 mg total) by mouth 3 (three) times daily as needed for cough.    Dispense:  20 capsule    Refill:  0    Follow-up: Return in about 3 months (around 07/30/2023) for anemia.    Donell Beers, FNP

## 2023-04-29 NOTE — Telephone Encounter (Signed)
Called and spoke with patient. She was informed that EMB insufficient and will need a repeat. Patient voiced understanding. Reviewed front office will be calling to schedule.

## 2023-04-29 NOTE — Assessment & Plan Note (Signed)
Lab Results  Component Value Date   WBC 5.8 03/14/2023   HGB 11.5 03/14/2023   HCT 37.6 03/14/2023   MCV 92 03/14/2023   PLT 299 03/14/2023   Lab Results  Component Value Date   IRON 11 (L) 03/07/2023   TIBC 585 (H) 03/07/2023   FERRITIN 1 (L) 03/07/2023    Continue ferrous sulfate 325 mg twice daily Follow-up in 3 months

## 2023-04-29 NOTE — Assessment & Plan Note (Signed)
Currently smokes about 3 cigarettes daily Patient encouraged to continue to intensify efforts at smoking cessation

## 2023-04-29 NOTE — Patient Instructions (Signed)
1. Acute cough  - benzonatate (TESSALON PERLES) 100 MG capsule; Take 1 capsule (100 mg total) by mouth 3 (three) times daily as needed for cough.  Dispense: 20 capsule; Refill: 0    It is important that you exercise regularly at least 30 minutes 5 times a week as tolerated  Think about what you will eat, plan ahead. Choose " clean, green, fresh or frozen" over canned, processed or packaged foods which are more sugary, salty and fatty. 70 to 75% of food eaten should be vegetables and fruit. Three meals at set times with snacks allowed between meals, but they must be fruit or vegetables. Aim to eat over a 12 hour period , example 7 am to 7 pm, and STOP after  your last meal of the day. Drink water,generally about 64 ounces per day, no other drink is as healthy. Fruit juice is best enjoyed in a healthy way, by EATING the fruit.  Thanks for choosing [facility] we consider it a privelige to serve you.

## 2023-04-29 NOTE — Assessment & Plan Note (Signed)
Patient encouraged to pick up metronidazole 500 mg tablet that was ordered by the gynecologist and take as ordered

## 2023-04-29 NOTE — Assessment & Plan Note (Signed)
-   benzonatate (TESSALON PERLES) 100 MG capsule; Take 1 capsule (100 mg total) by mouth 3 (three) times daily as needed for cough.  Dispense: 20 capsule; Refill: 0

## 2023-05-09 ENCOUNTER — Ambulatory Visit: Payer: Self-pay | Admitting: Cardiology

## 2023-05-13 ENCOUNTER — Ambulatory Visit: Payer: Self-pay

## 2023-05-23 ENCOUNTER — Ambulatory Visit: Payer: Self-pay | Admitting: Cardiology

## 2023-06-15 ENCOUNTER — Ambulatory Visit: Payer: 59 | Admitting: Obstetrics & Gynecology

## 2023-07-18 ENCOUNTER — Ambulatory Visit: Payer: 59 | Attending: Cardiology | Admitting: Cardiology

## 2023-07-18 ENCOUNTER — Encounter: Payer: Self-pay | Admitting: Cardiology

## 2023-07-18 VITALS — BP 112/66 | HR 83 | Resp 16 | Ht 61.0 in | Wt 228.2 lb

## 2023-07-18 DIAGNOSIS — Z8249 Family history of ischemic heart disease and other diseases of the circulatory system: Secondary | ICD-10-CM

## 2023-07-18 DIAGNOSIS — R011 Cardiac murmur, unspecified: Secondary | ICD-10-CM

## 2023-07-18 DIAGNOSIS — F17210 Nicotine dependence, cigarettes, uncomplicated: Secondary | ICD-10-CM

## 2023-07-18 MED ORDER — BUPROPION HCL ER (SR) 100 MG PO TB12
100.0000 mg | ORAL_TABLET | Freq: Two times a day (BID) | ORAL | 5 refills | Status: DC
Start: 2023-07-18 — End: 2023-08-10

## 2023-07-18 NOTE — Patient Instructions (Signed)
 Medication Instructions:  START Wellbutring 100 mg take one tablet twice daily   *If you need a refill on your cardiac medications before your next appointment, please call your pharmacy*   Lab Work: Lipid panel  If you have labs (blood work) drawn today and your tests are completely normal, you will receive your results only by: MyChart Message (if you have MyChart) OR A paper copy in the mail If you have any lab test that is abnormal or we need to change your treatment, we will call you to review the results.   Testing/Procedures: Echo  Your physician has requested that you have an echocardiogram. Echocardiography is a painless test that uses sound waves to create images of your heart. It provides your doctor with information about the size and shape of your heart and how well your heart's chambers and valves are working. This procedure takes approximately one hour. There are no restrictions for this procedure. Please do NOT wear cologne, perfume, aftershave, or lotions (deodorant is allowed). Please arrive 15 minutes prior to your appointment time.  Please note: We ask at that you not bring children with you during ultrasound (echo/ vascular) testing. Due to room size and safety concerns, children are not allowed in the ultrasound rooms during exams. Our front office staff cannot provide observation of children in our lobby area while testing is being conducted. An adult accompanying a patient to their appointment will only be allowed in the ultrasound room at the discretion of the ultrasound technician under special circumstances. We apologize for any inconvenience.   Cardiac Calcium Score (SELF PAY)     Follow-Up: At [facility], you and your health needs are our priority.  As part of our continuing mission to provide you with exceptional heart care, we have created designated Provider Care Teams.  These Care Teams include your primary Cardiologist (physician) and  Advanced Practice Providers (APPs -  Physician Assistants and Nurse Practitioners) who all work together to provide you with the care you need, when you need it.   Your next appointment:   3 month(s)  Provider:   Lovette Rud, PA-C, Dayna Dunn, PA-C, Charles Connor, NP, Theotis Flake, PA-C, Slater Duncan, NP, Marlyse Single, PA-C, or Leala Prince, PA-C      Other Instructions    1st Floor: - Lobby - Registration  - Pharmacy  - Lab - Cafe  2nd Floor: - PV Lab - Diagnostic Testing (echo, CT, nuclear med)  3rd Floor: - Vacant  4th Floor: - TCTS (cardiothoracic surgery) - AFib Clinic - Structural Heart Clinic - Vascular Surgery  - Vascular Ultrasound  5th Floor: - HeartCare Cardiology (general and EP) - Clinical Pharmacy for coumadin, hypertension, lipid, weight-loss medications, and med management appointments    Valet parking services will be available as well.

## 2023-07-18 NOTE — Progress Notes (Signed)
 Cardiology Office Note:  .   Date:  07/18/2023  ID:  Cheryl Crane, DOB December 31, 1985, MRN [id number] PCP: Paseda, Folashade R, FNP  [facility] Providers Cardiologist:  Fransico Ivy, MD PCP: Paseda, Folashade R, FNP  Chief Complaint  Patient presents with   Heart Murmur   New Patient (Initial Visit)      History of Present Illness: Cheryl Crane    Cheryl Crane is a 38 y.o. female with symptomatic anemia, fibroid uterus, obesity, family history of early CAD, nicotine dependence  Patient works as a Building control surveyor.  She has fibroid uterus causing anemia, for which she is being on iron  supplements.  She was admitted to the hospital in 03/2023 with symptomatic anemia, underwent blood transfusion.  She was found to have a heart murmur at that time, and therefore referred to cardiology.  Patient denies any chest pain, shortness of breath, orthopnea, PND symptoms.  Patient mentions that her father died of coronary artery disease 4 years ago, he had his first event before the age of 38.  Patient smokes up to 1 pack/day, not tolerate nicotine patch in the past  Vitals:   07/18/23 1007  BP: 112/66  Pulse: 83  Resp: 16  SpO2: 96%     ROS:  Review of Systems  Cardiovascular:  Negative for chest pain, dyspnea on exertion, leg swelling, palpitations and syncope.     Studies Reviewed: Cheryl Crane        EKG 07/18/2023: Normal sinus rhythm Cannot rule out Anterior infarct (cited on or before 08-Mar-2023) When compared with ECG of 08-Mar-2023 18:04, No significant change was found    Independently interpreted 03/2023: Hb 11.5 Cr 0.43 T. Bili 1.4    Physical Exam:   Physical Exam Vitals and nursing note reviewed.  Constitutional:      General: She is not in acute distress.    Appearance: She is obese.  Neck:     Vascular: No JVD.  Cardiovascular:     Rate and Rhythm: Normal rate and regular rhythm.     Heart sounds: Murmur heard.     Harsh midsystolic murmur is present with a  grade of 1/6 at the upper right sternal border radiating to the neck.  Pulmonary:     Effort: Pulmonary effort is normal.     Breath sounds: Normal breath sounds. No wheezing or rales.  Musculoskeletal:     Right lower leg: No edema.     Left lower leg: No edema.      VISIT DIAGNOSES:   ICD-10-CM   1. Heart murmur  R01.1 EKG 12-Lead    ECHOCARDIOGRAM COMPLETE    2. Nicotine dependence, cigarettes, uncomplicated  F17.210 buPROPion  ER (WELLBUTRIN  SR) 100 MG 12 hr tablet    3. Family history of early CAD  Z46.49 Lipid panel    CT CARDIAC SCORING (SELF PAY ONLY)       ASSESSMENT AND PLAN: .    Cheryl Crane is a 38 y.o. female with symptomatic anemia, fibroid uterus, obesity, family history of early CAD, nicotine dependence  Murmur: Most likely functional murmur in the setting of anemia. Will check echocardiogram.  Family history of early CAD: Discussed heart healthy diet and lifestyle, smoking cessation. Check lipid panel and CT cardiac scoring scan for risk stratification.    Nicotine dependence: Patient smokes up to a pack per day, did not tolerate nicotine patch. Recommend Wellbutrin  100 mg twice daily This can be increased to 200 mg twice daily at future visit,  if necessary.  No orders of the defined types were placed in this encounter.    F/u in 3 months  Signed, Cody Das, MD

## 2023-07-19 ENCOUNTER — Encounter: Payer: Self-pay | Admitting: Cardiology

## 2023-07-19 LAB — LIPID PANEL
Chol/HDL Ratio: 5.2 {ratio} — ABNORMAL HIGH (ref 0.0–4.4)
Cholesterol, Total: 171 mg/dL (ref 100–199)
HDL: 33 mg/dL — ABNORMAL LOW (ref >39)
LDL Chol Calc (NIH): 103 mg/dL — ABNORMAL HIGH (ref 0–99)
Triglycerides: 200 mg/dL — ABNORMAL HIGH (ref 0–149)
VLDL Cholesterol Cal: 35 mg/dL (ref 5–40)

## 2023-07-29 ENCOUNTER — Ambulatory Visit (INDEPENDENT_AMBULATORY_CARE_PROVIDER_SITE_OTHER): Payer: 59 | Admitting: Nurse Practitioner

## 2023-07-29 ENCOUNTER — Encounter: Payer: Self-pay | Admitting: Nurse Practitioner

## 2023-07-29 VITALS — BP 136/65 | HR 93 | Temp 97.9°F | Ht 61.0 in | Wt 231.0 lb

## 2023-07-29 DIAGNOSIS — N920 Excessive and frequent menstruation with regular cycle: Secondary | ICD-10-CM | POA: Diagnosis not present

## 2023-07-29 DIAGNOSIS — E669 Obesity, unspecified: Secondary | ICD-10-CM

## 2023-07-29 DIAGNOSIS — N898 Other specified noninflammatory disorders of vagina: Secondary | ICD-10-CM

## 2023-07-29 DIAGNOSIS — Z720 Tobacco use: Secondary | ICD-10-CM

## 2023-07-29 DIAGNOSIS — E66813 Obesity, class 3: Secondary | ICD-10-CM

## 2023-07-29 DIAGNOSIS — D649 Anemia, unspecified: Secondary | ICD-10-CM | POA: Diagnosis not present

## 2023-07-29 DIAGNOSIS — Z0000 Encounter for general adult medical examination without abnormal findings: Secondary | ICD-10-CM

## 2023-07-29 MED ORDER — TRANEXAMIC ACID 650 MG PO TABS
1300.0000 mg | ORAL_TABLET | Freq: Three times a day (TID) | ORAL | 2 refills | Status: DC
Start: 2023-07-29 — End: 2023-09-30

## 2023-07-29 NOTE — Assessment & Plan Note (Signed)
-

## 2023-07-29 NOTE — Assessment & Plan Note (Addendum)
 Wt Readings from Last 3 Encounters:  07/29/23 231 lb (104.8 kg)  07/18/23 228 lb 3.2 oz (103.5 kg)  04/29/23 209 lb (94.8 kg)   Body mass index is 43.65 kg/m.  Patient has been 22 pounds since her last visit States that she walks 1 mile daily, diet can be better Patient counseled on low-carb diet Patient encouraged to engage in regular moderate to vigorous exercise at least 150 minutes weekly as tolerated Patient referred to medical weight management

## 2023-07-29 NOTE — Patient Instructions (Addendum)
   1. Symptomatic anemia (Primary)  - CBC - Iron, TIBC and Ferritin Panel - tranexamic acid (LYSTEDA) 650 MG TABS tablet; Take 2 tablets (1,300 mg total) by mouth 3 (three) times daily. Take during menses for a maximum of five days  Dispense: 30 tablet; Refill: 2  2. Vaginal irritation  - NuSwab Vaginitis Plus (VG+)  3. Obesity (BMI 30-39.9)   4. Menorrhagia with regular cycle  - traexamic acid (LYSTEDA) 650 MG TABS tablet; Take 2 tablets (1,300 mg total) by mouth 3 (three) times daily. Take during menses for a maximum of five days  Dispense: 30 tablet; Refill: 2   It is important that you exercise regularly at least 30 minutes 5 times a week as tolerated  Think about what you will eat, plan ahead. Choose " clean, green, fresh or frozen" over canned, processed or packaged foods which are more sugary, salty and fatty. 70 to 75% of food eaten should be vegetables and fruit. Three meals at set times with snacks allowed between meals, but they must be fruit or vegetables. Aim to eat over a 12 hour period , example 7 am to 7 pm, and STOP after  your last meal of the day. Drink water,generally about 64 ounces per day, no other drink is as healthy. Fruit juice is best enjoyed in a healthy way, by EATING the fruit.  Thanks for choosing [facility] we consider it a privelige to serve you.

## 2023-07-29 NOTE — Progress Notes (Signed)
 Established Patient Office Visit  Subjective:  Patient ID: Cheryl Crane, female    DOB: April 16, 1986  Age: 38 y.o. MRN: [id number]  CC:  Chief Complaint  Patient presents with   Medical Management of Chronic Issues    HPI Cheryl Crane is a 38 y.o. female  has a past medical history of Abnormal uterine bleeding (03/09/2023), Asthma, Asthma, chronic (06/08/2015), Bronchitis, Fibroid uterus (07/31/2012), Menorrhagia with regular cycle (12/10/2021), Obesity (BMI 30-39.9) (10/25/2021), Symptomatic anemia (06/08/2015), Thrombocytosis (03/08/2023), and Tobacco use (10/25/2021).  Patient presents for follow-up for anemia  Symptomatic anemia.  Currently on ferrous sulfate 325 mg twice daily, takes TXA for menorrhagia that has been very helpful.  Patient denies fatigue.  She is interested in getting surgery to remove her fibroids.  Nicotine dependence.  Smokes 1 to 2 cigarettes daily.  Was started on bupropion by cardiology, stated that she gets nauseous when she tries to smoke since she started taking the medication.  Did not tolerate nicotine patch.  She is interested in trying the nicotine gums.  Vaginal irritation.  Patient complains of vaginal irritation with clear vaginal discharge.  No abdominal pain, dysuria, vaginal rashes    Past Medical History:  Diagnosis Date   Abnormal uterine bleeding 03/09/2023   Asthma    Asthma, chronic 06/08/2015   Bronchitis    Fibroid uterus 07/31/2012   3 seen.. All about 6cm     Menorrhagia with regular cycle 12/10/2021   Obesity (BMI 30-39.9) 10/25/2021   Symptomatic anemia 06/08/2015   Thrombocytosis 03/08/2023   Tobacco use 10/25/2021    History reviewed. No pertinent surgical history.  Family History  Problem Relation Age of Onset   Diabetes Mother    Breast cancer Mother    Fibroids Mother    Heart disease Father    Cancer - Lung Father    Fibroids Maternal Grandfather     Social History   Socioeconomic History   Marital  status: Single    Spouse name: Not on file   Number of children: Not on file   Years of education: Not on file   Highest education level: Not on file  Occupational History   Not on file  Tobacco Use   Smoking status: Some Days    Current packs/day: 0.15    Types: Cigarettes   Smokeless tobacco: Not on file  Vaping Use   Vaping status: Never Used  Substance and Sexual Activity   Alcohol use: Yes    Comment: occasional   Drug use: No   Sexual activity: Yes  Other Topics Concern   Not on file  Social History Narrative   Lives with her grandmother    Social Drivers of Corporate investment banker Strain: Not on file  Food Insecurity: No Food Insecurity (03/22/2023)   Hunger Vital Sign    Worried About Running Out of Food in the Last Year: Never true    Ran Out of Food in the Last Year: Never true  Recent Concern: Food Insecurity - Food Insecurity Present (03/08/2023)   Hunger Vital Sign    Worried About Running Out of Food in the Last Year: Sometimes true    Ran Out of Food in the Last Year: Sometimes true  Transportation Needs: No Transportation Needs (03/22/2023)   PRAPARE - Administrator, Civil Service (Medical): No    Lack of Transportation (Non-Medical): No  Physical Activity: Not on file  Stress: Not on file  Social Connections: Not on  file  Intimate Partner Violence: Not At Risk (03/08/2023)   Humiliation, Afraid, Rape, and Kick questionnaire    Fear of Current or Ex-Partner: No    Emotionally Abused: No    Physically Abused: No    Sexually Abused: No    Outpatient Medications Prior to Visit  Medication Sig Dispense Refill   acetaminophen (TYLENOL) 500 MG tablet Take 1,000 mg by mouth every 6 (six) hours as needed for mild pain.     albuterol (VENTOLIN HFA) 108 (90 Base) MCG/ACT inhaler Inhale 2 puffs into the lungs every 6 (six) hours as needed for wheezing or shortness of breath.     buPROPion ER (WELLBUTRIN SR) 100 MG 12 hr tablet Take 1 tablet  (100 mg total) by mouth 2 (two) times daily. For nicotine dependence 30 tablet 5   ferrous sulfate 325 (65 FE) MG tablet Take 1 tablet (325 mg total) by mouth 2 (two) times daily with a meal. 60 tablet 0   tranexamic acid (LYSTEDA) 650 MG TABS tablet Take 2 tablets (1,300 mg total) by mouth 3 (three) times daily. Take during menses for a maximum of five days 30 tablet 2   metroNIDAZOLE (FLAGYL) 500 MG tablet Take 1 tablet (500 mg total) by mouth 2 (two) times daily. (Patient not taking: Reported on 07/29/2023) 14 tablet 0   benzonatate (TESSALON PERLES) 100 MG capsule Take 1 capsule (100 mg total) by mouth 3 (three) times daily as needed for cough. (Patient not taking: Reported on 07/29/2023) 20 capsule 0   No facility-administered medications prior to visit.    No Known Allergies  ROS Review of Systems  Constitutional:  Negative for appetite change, chills, fatigue and fever.  HENT:  Negative for congestion, postnasal drip, rhinorrhea and sneezing.   Respiratory:  Negative for cough, shortness of breath and wheezing.   Cardiovascular:  Negative for chest pain, palpitations and leg swelling.  Gastrointestinal:  Negative for abdominal pain, constipation, nausea and vomiting.  Genitourinary:  Negative for difficulty urinating, dysuria, flank pain, frequency, genital sores and hematuria.  Musculoskeletal:  Negative for arthralgias, back pain, joint swelling and myalgias.  Skin:  Negative for color change, pallor, rash and wound.  Neurological:  Negative for dizziness, facial asymmetry, weakness, numbness and headaches.  Psychiatric/Behavioral:  Negative for behavioral problems, confusion, self-injury and suicidal ideas.       Objective:    Physical Exam Vitals and nursing note reviewed.  Constitutional:      General: She is not in acute distress.    Appearance: Normal appearance. She is obese. She is not ill-appearing, toxic-appearing or diaphoretic.  HENT:     Mouth/Throat:     Mouth:  Mucous membranes are moist.     Pharynx: Oropharynx is clear. No oropharyngeal exudate or posterior oropharyngeal erythema.  Eyes:     General: No scleral icterus.       Right eye: No discharge.        Left eye: No discharge.     Extraocular Movements: Extraocular movements intact.     Conjunctiva/sclera: Conjunctivae normal.  Cardiovascular:     Rate and Rhythm: Normal rate and regular rhythm.     Pulses: Normal pulses.     Heart sounds: Murmur heard.     No friction rub. No gallop.  Pulmonary:     Effort: Pulmonary effort is normal. No respiratory distress.     Breath sounds: Normal breath sounds. No stridor. No wheezing, rhonchi or rales.  Chest:     Chest  wall: No tenderness.  Abdominal:     General: There is no distension.     Palpations: Abdomen is soft.     Tenderness: There is no abdominal tenderness. There is no right CVA tenderness, left CVA tenderness or guarding.  Musculoskeletal:        General: No swelling, tenderness, deformity or signs of injury.     Right lower leg: No edema.     Left lower leg: No edema.  Skin:    General: Skin is warm and dry.     Capillary Refill: Capillary refill takes less than 2 seconds.     Coloration: Skin is not jaundiced or pale.     Findings: No bruising, erythema or lesion.  Neurological:     Mental Status: She is alert and oriented to person, place, and time.     Motor: No weakness.     Coordination: Coordination normal.     Gait: Gait normal.  Psychiatric:        Mood and Affect: Mood normal.        Behavior: Behavior normal.        Thought Content: Thought content normal.        Judgment: Judgment normal.     BP 136/65   Pulse 93   Temp 97.9 F (36.6 C)   Ht 5\' 1"  (1.549 m)   Wt 231 lb (104.8 kg)   SpO2 98%   BMI 43.65 kg/m  Wt Readings from Last 3 Encounters:  07/29/23 231 lb (104.8 kg)  07/18/23 228 lb 3.2 oz (103.5 kg)  04/29/23 209 lb (94.8 kg)    Lab Results  Component Value Date   TSH 4.691 (H)  10/26/2021   Lab Results  Component Value Date   WBC 5.8 03/14/2023   HGB 11.5 03/14/2023   HCT 37.6 03/14/2023   MCV 92 03/14/2023   PLT 299 03/14/2023   Lab Results  Component Value Date   NA 138 03/09/2023   K 3.7 03/09/2023   CO2 22 03/09/2023   GLUCOSE 98 03/09/2023   BUN 6 03/09/2023   CREATININE 0.43 (L) 03/09/2023   BILITOT 1.4 (H) 03/09/2023   ALKPHOS 114 03/09/2023   AST 13 (L) 03/09/2023   ALT 12 03/09/2023   PROT 6.7 03/09/2023   ALBUMIN 3.7 03/09/2023   CALCIUM 8.4 (L) 03/09/2023   ANIONGAP 8 03/09/2023   Lab Results  Component Value Date   CHOL 171 07/18/2023   Lab Results  Component Value Date   HDL 33 (L) 07/18/2023   Lab Results  Component Value Date   LDLCALC 103 (H) 07/18/2023   Lab Results  Component Value Date   TRIG 200 (H) 07/18/2023   Lab Results  Component Value Date   CHOLHDL 5.2 (H) 07/18/2023   No results found for: "HGBA1C"    Assessment & Plan:   Problem List Items Addressed This Visit       Other   Symptomatic anemia - Primary    Continue ferrous sulfate 325 mg twice daily - CBC - Iron, TIBC and Ferritin Panel - tranexamic acid (LYSTEDA) 650 MG TABS tablet; Take 2 tablets (1,300 mg total) by mouth 3 (three) times daily. Take during menses for a maximum of five days  Dispense: 30 tablet; Refill: 2        Relevant Medications   tranexamic acid (LYSTEDA) 650 MG TABS tablet   Other Relevant Orders   CBC   Iron, TIBC and Ferritin Panel   Tobacco use  Smoking cessation encouraged Wondering if she can get nicotine gum for free, patient encouraged to call 1-800-QUIT-NOW       Class 3 obesity   Wt Readings from Last 3 Encounters:  07/29/23 231 lb (104.8 kg)  07/18/23 228 lb 3.2 oz (103.5 kg)  04/29/23 209 lb (94.8 kg)   Body mass index is 43.65 kg/m.  Patient has been 22 pounds since her last visit States that she walks 1 mile daily, diet can be better Patient counseled on low-carb diet Patient encouraged to  engage in regular moderate to vigorous exercise at least 150 minutes weekly as tolerated Patient referred to medical weight management       Menorrhagia with regular cycle   She is interested in surgery for fibroids Patient encouraged to follow-up with GYN - tranexamic acid (LYSTEDA) 650 MG TABS tablet; Take 2 tablets (1,300 mg total) by mouth 3 (three) times daily. Take during menses for a maximum of five days  Dispense: 30 tablet; Refill: 2        Relevant Medications   tranexamic acid (LYSTEDA) 650 MG TABS tablet   Vaginal irritation    - NuSwab Vaginitis Plus (VG+)      Relevant Orders   NuSwab Vaginitis Plus (VG+)   Other Visit Diagnoses       Health care maintenance       Relevant Orders   CMP14+EGFR       Meds ordered this encounter  Medications   tranexamic acid (LYSTEDA) 650 MG TABS tablet    Sig: Take 2 tablets (1,300 mg total) by mouth 3 (three) times daily. Take during menses for a maximum of five days    Dispense:  30 tablet    Refill:  2    Follow-up: Return in about 3 months (around 10/26/2023) for anemia. Donell Beers, FNP

## 2023-07-29 NOTE — Assessment & Plan Note (Signed)
 She is interested in surgery for fibroids Patient encouraged to follow-up with GYN - tranexamic acid (LYSTEDA) 650 MG TABS tablet; Take 2 tablets (1,300 mg total) by mouth 3 (three) times daily. Take during menses for a maximum of five days  Dispense: 30 tablet; Refill: 2

## 2023-07-29 NOTE — Assessment & Plan Note (Signed)
  Continue ferrous sulfate 325 mg twice daily - CBC - Iron, TIBC and Ferritin Panel - tranexamic acid (LYSTEDA) 650 MG TABS tablet; Take 2 tablets (1,300 mg total) by mouth 3 (three) times daily. Take during menses for a maximum of five days  Dispense: 30 tablet; Refill: 2

## 2023-07-29 NOTE — Assessment & Plan Note (Signed)
 Smoking cessation encouraged Wondering if she can get nicotine gum for free, patient encouraged to call 1-800-QUIT-NOW

## 2023-07-30 LAB — CMP14+EGFR
ALT: 21 IU/L (ref 0–32)
AST: 20 IU/L (ref 0–40)
Albumin: 4.6 g/dL (ref 3.9–4.9)
Alkaline Phosphatase: 113 IU/L (ref 44–121)
BUN/Creatinine Ratio: 14 (ref 9–23)
BUN: 8 mg/dL (ref 6–20)
Bilirubin Total: 0.4 mg/dL (ref 0.0–1.2)
CO2: 23 mmol/L (ref 20–29)
Calcium: 9.6 mg/dL (ref 8.7–10.2)
Chloride: 104 mmol/L (ref 96–106)
Creatinine, Ser: 0.56 mg/dL — ABNORMAL LOW (ref 0.57–1.00)
Globulin, Total: 2.8 g/dL (ref 1.5–4.5)
Glucose: 86 mg/dL (ref 70–99)
Potassium: 4.9 mmol/L (ref 3.5–5.2)
Sodium: 141 mmol/L (ref 134–144)
Total Protein: 7.4 g/dL (ref 6.0–8.5)
eGFR: 120 mL/min/{1.73_m2} (ref >59)

## 2023-07-30 LAB — CBC
Hematocrit: 35.2 % (ref 34.0–46.6)
Hemoglobin: 10.3 g/dL — ABNORMAL LOW (ref 11.1–15.9)
MCH: 22.7 pg — ABNORMAL LOW (ref 26.6–33.0)
MCHC: 29.3 g/dL — ABNORMAL LOW (ref 31.5–35.7)
MCV: 78 fL — ABNORMAL LOW (ref 79–97)
Platelets: 487 10*3/uL — ABNORMAL HIGH (ref 150–450)
RBC: 4.53 x10E6/uL (ref 3.77–5.28)
RDW: 18.8 % — ABNORMAL HIGH (ref 11.7–15.4)
WBC: 4.2 10*3/uL (ref 3.4–10.8)

## 2023-07-30 LAB — IRON,TIBC AND FERRITIN PANEL
Ferritin: 46 ng/mL (ref 15–150)
Iron Saturation: 92 % (ref 15–55)
Iron: 442 ug/dL (ref 27–159)
Total Iron Binding Capacity: 481 ug/dL — ABNORMAL HIGH (ref 250–450)
UIBC: 39 ug/dL — ABNORMAL LOW (ref 131–425)

## 2023-08-03 LAB — NUSWAB VAGINITIS PLUS (VG+)
Candida albicans, NAA: NEGATIVE
Candida glabrata, NAA: NEGATIVE

## 2023-08-08 ENCOUNTER — Other Ambulatory Visit: Payer: Self-pay | Admitting: Cardiology

## 2023-08-08 DIAGNOSIS — F17210 Nicotine dependence, cigarettes, uncomplicated: Secondary | ICD-10-CM

## 2023-08-10 NOTE — Telephone Encounter (Signed)
 Okay to refill.  However, we need to emphasize importance of avoiding pregnancy.  Bupropion is contraindicated in lactating mothers.  Thanks MJP

## 2023-08-17 ENCOUNTER — Encounter: Payer: Self-pay | Admitting: Cardiology

## 2023-08-17 ENCOUNTER — Ambulatory Visit (HOSPITAL_COMMUNITY)
Admission: RE | Admit: 2023-08-17 | Discharge: 2023-08-17 | Disposition: A | Discharge status: Home / Self Care | Payer: Self-pay | Source: Ambulatory Visit | Attending: Cardiology | Admitting: Cardiology

## 2023-08-17 ENCOUNTER — Ambulatory Visit (HOSPITAL_BASED_OUTPATIENT_CLINIC_OR_DEPARTMENT_OTHER): Payer: Self-pay

## 2023-08-17 DIAGNOSIS — R011 Cardiac murmur, unspecified: Secondary | ICD-10-CM

## 2023-08-17 DIAGNOSIS — Z8249 Family history of ischemic heart disease and other diseases of the circulatory system: Secondary | ICD-10-CM | POA: Insufficient documentation

## 2023-08-17 LAB — ECHOCARDIOGRAM COMPLETE
AR max vel: 1.91 cm2
AV Area VTI: 2.04 cm2
AV Area mean vel: 1.95 cm2
AV Mean grad: 10 mmHg
AV Peak grad: 15.2 mmHg
Ao pk vel: 1.95 m/s
Area-P 1/2: 3.72 cm2
S' Lateral: 2.5 cm

## 2023-08-31 ENCOUNTER — Ambulatory Visit: Payer: Self-pay | Admitting: Nurse Practitioner

## 2023-08-31 ENCOUNTER — Encounter (INDEPENDENT_AMBULATORY_CARE_PROVIDER_SITE_OTHER): Payer: MEDICAID | Admitting: Family Medicine

## 2023-09-30 ENCOUNTER — Other Ambulatory Visit: Payer: Self-pay | Admitting: Nurse Practitioner

## 2023-09-30 DIAGNOSIS — D649 Anemia, unspecified: Secondary | ICD-10-CM

## 2023-09-30 DIAGNOSIS — N920 Excessive and frequent menstruation with regular cycle: Secondary | ICD-10-CM

## 2023-09-30 MED ORDER — TRANEXAMIC ACID 650 MG PO TABS: 1300.0000 mg | ORAL_TABLET | Freq: Three times a day (TID) | ORAL | 2 refills | Status: AC

## 2023-09-30 NOTE — Telephone Encounter (Signed)
 Copied from CRM [id number]. Topic: Clinical - Medication Refill >> Sep 30, 2023  2:34 PM Phil Braun wrote: Most Recent Primary Care Visit:  Provider: Jacquetta Mattocks  Department: [facility]  Visit Type: OFFICE VISIT  Date: 07/29/2023  Medication: tranexamic acid  (LYSTEDA ) 650 MG TABS tablet  Has the patient contacted their pharmacy? Yes  [id number] [place], [place] - [address] Otha Blight [place] [address] Phone: [phone] Fax: [phone] Is this the correct pharmacy for this prescription? Yes  This is the patient's preferred pharmacy:     Has the prescription been filled recently? Yes  Is the patient out of the medication? Yes  Has the patient been seen for an appointment in the last year OR does the patient have an upcoming appointment? Yes  Can we respond through MyChart? Yes  Agent: Please be advised that Rx refills may take up to 3 business days. We ask that you follow-up with your pharmacy.

## 2023-10-05 ENCOUNTER — Encounter (INDEPENDENT_AMBULATORY_CARE_PROVIDER_SITE_OTHER): Payer: MEDICAID | Admitting: Family Medicine

## 2023-10-05 ENCOUNTER — Encounter (INDEPENDENT_AMBULATORY_CARE_PROVIDER_SITE_OTHER): Payer: Self-pay

## 2023-10-14 NOTE — Progress Notes (Signed)
 " Cardiology Office Note    Patient Name: Cheryl Crane Date of Encounter: 10/14/2023  Primary Care Provider:  Paseda, Folashade R, FNP Primary Cardiologist:  Newman JINNY Lawrence, MD Primary Electrophysiologist: None   Past Medical History    Past Medical History:  Diagnosis Date   Abnormal uterine bleeding 03/09/2023   Asthma    Asthma, chronic 06/08/2015   Bronchitis    Fibroid uterus 07/31/2012   3 seen.. All about 6cm     Menorrhagia with regular cycle 12/10/2021   Obesity (BMI 30-39.9) 10/25/2021   Symptomatic anemia 06/08/2015   Thrombocytosis 03/08/2023   Tobacco use 10/25/2021    History of Present Illness  Cheryl Crane is a 38 y.o. female with a PMH of family history of early CAD, tobacco abuse, symptomatic anemia, obesity, systolic murmur who presents today for 71-month follow-up.  Ms. Eaker was seen initially by Dr. Christophe on 07/18/2023 for evaluation of heart murmur.  She has a history of uterine fibroids and symptomatic anemia.  She also has a past medical history of early CAD with father dying from coronary disease before the age of 69.  During her visit her blood pressure was well-controlled and patient completed a 2D echo for further evaluation of heart murmur that showed normal EF of 60 to 65% with grade 1 DD and no significant valve abnormalities noted.  She also completed a coronary calcium score that showed no evidence of coronary calcium.  Ms. Dewalt presents today for 70-month follow-up and to review echo and calcium score results.  She reports doing well since her previous follow-up but does note occasional ankle swelling. She has anemia, and heavy bleeding from fibroids may contribute to swelling. She experienced a heart flutter on March 13, lasting about one to two minutes, but has not experienced it since. No continuous or prolonged episodes of palpitations. No chest pain or shortness of breath. She feels she is gaining weight, possibly due to increased  eating after reducing smoking. She notes a weight gain of four pounds since her last visit and is concerned this may be due to fluid retention, or sodium indiscretions in her diet. 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 She has a history of tobacco use and has been attempting to quit smoking. She previously used Wellbutrin  and nicotine  patches but stopped due to nausea. She has significantly reduced her cigarette consumption, now only occasionally smoking. She has a family history of diabetes and is at high risk, though she has not been diagnosed with diabetes herself. She has elevated triglycerides and cholesterol, and she struggles with dietary changes, particularly reducing salt intake. Patient denies chest pain, palpitations, dyspnea, PND, orthopnea, nausea, vomiting, dizziness, syncope, edema, weight gain, or early satiety.  Discussed the use of AI scribe software for clinical note transcription with the patient, who gave verbal consent to proceed.  History of Present Illness   Review of Systems  Please see the history of present illness.    All other systems reviewed and are otherwise negative except as noted above.  Physical Exam     Wt Readings from Last 3 Encounters:  07/29/23 231 lb (104.8 kg)  07/18/23 228 lb 3.2 oz (103.5 kg)  04/29/23 209 lb (94.8 kg)   CD:Uyzmz were no vitals filed for this visit.,There is no height or weight on file to calculate BMI. GEN: Well nourished, well developed in no acute distress Neck: No  JVD; No carotid bruits Pulmonary: Clear to auscultation without rales, wheezing or rhonchi  Cardiovascular: Normal rate. Regular rhythm. Normal S1. Normal S2.   Murmurs: There is no murmur.  ABDOMEN: Soft, non-tender, non-distended EXTREMITIES:  No edema; No deformity   EKG/LABS/ Recent Cardiac Studies   ECG personally reviewed by me today -none completed  today  Risk Assessment/Calculations:          Lab Results  Component Value Date   WBC 4.2 07/29/2023   HGB 10.3 (L) 07/29/2023   HCT 35.2 07/29/2023   MCV 78 (L) 07/29/2023   PLT 487 (H) 07/29/2023   Lab Results  Component Value Date   CREATININE 0.56 (L) 07/29/2023   BUN 8 07/29/2023   NA 141 07/29/2023   K 4.9 07/29/2023   CL 104 07/29/2023   CO2 23 07/29/2023   Lab Results  Component Value Date   CHOL 171 07/18/2023   HDL 33 (L) 07/18/2023   LDLCALC 103 (H) 07/18/2023   TRIG 200 (H) 07/18/2023   CHOLHDL 5.2 (H) 07/18/2023    No results found for: HGBA1C Assessment & Plan    Assessment & Plan  1.  Family history of CAD: - Calcium score completed showing score of 0 with no evidence of coronary calcifications noted. - Patient noted to have elevated cholesterol and triglycerides with recommendation of Mediterranean diet and low sodium diet with physical activity.  2.  Mitral valve regurgitation:  Mild regurgitation with trivial leaking, no significant valvular changes. No intervention needed. - Repeat echocardiogram in 3-5 years.  3.  Obesity: - Patient's BMI is 44.48 kg/m -Weight gain noted, lifestyle modifications discussed. Not a candidate for weight loss medications. - Recommend Mediterranean and low-sodium diet. - Encourage regular exercise, consider gym or program. - Check hemoglobin A1c for diabetes   4.  Tobacco abuse: -Reduced smoking, stopped Wellbutrin  and patches due to side effects. Encouraged cessation to reduce cardiovascular risk. - Encourage nicotine  gum use. - Advise calling 1-800-QUIT-NOW for support. - Monitor for gum side effects, consider Wellbutrin  rechallenge if needed.   Disposition: Follow-up with Newman JINNY Lawrence, MD or APP as needed    Signed, Wyn Raddle, Jackee Shove, NP 10/14/2023, 1:05 PM [facility] "

## 2023-10-18 ENCOUNTER — Ambulatory Visit: Payer: 59 | Attending: Nurse Practitioner | Admitting: Nurse Practitioner

## 2023-10-18 ENCOUNTER — Encounter: Payer: Self-pay | Admitting: Nurse Practitioner

## 2023-10-18 VITALS — BP 138/62 | HR 91 | Ht 61.0 in | Wt 235.4 lb

## 2023-10-18 DIAGNOSIS — F17210 Nicotine dependence, cigarettes, uncomplicated: Secondary | ICD-10-CM

## 2023-10-18 DIAGNOSIS — Z8249 Family history of ischemic heart disease and other diseases of the circulatory system: Secondary | ICD-10-CM

## 2023-10-18 DIAGNOSIS — E66813 Obesity, class 3: Secondary | ICD-10-CM

## 2023-10-18 DIAGNOSIS — I340 Nonrheumatic mitral (valve) insufficiency: Secondary | ICD-10-CM

## 2023-10-18 DIAGNOSIS — R011 Cardiac murmur, unspecified: Secondary | ICD-10-CM

## 2023-10-18 NOTE — Progress Notes (Signed)
 Heart and Vascular Care Navigation  10/18/2023  ELVENIA Crane 03-07-1986 [id number]  Reason for Referral: self pay Patient is participating in a Managed Medicaid Plan: No, self pay only  Engaged with patient face to face for initial visit for Heart and Vascular Care Coordination.                                                                                                   Assessment:                                     LCSW was able to meet with pt at [facility] clinic today. Introduced self, role, reason for visit. Pt confirmed self pay status. Confirmed home address, PCP, and emergency contact remains her mother/confirmed contact information. Pt is employed, shares she would benefit from Medicaid- applied but isnt sure she was determined to be eligible. We discussed [facility] caseworker team next door and agreeable to soft hand off/going to check in with that team. If for some reason she was not eligible then she may be eligible for assistance with bills through [facility] program. Pt provided with information about both options.   No other concerns noted relating to transportation, housing, utilities or food. Encouraged her to let us  know if that changes and we can be of any additional assistance.  Will mail her a business card should she have further questions after visit.     HRT/VAS Care Coordination     Patients Home Cardiology Office --  [facility]   Outpatient Care Team Social Worker   Social Worker Name: Nathen Balder, [place], [phone]   Living arrangements for the past 2 months Single Family Home   Lives with: Parents   Patient Current Insurance Coverage Medicaid   Patient Has Concern With Paying Medical Bills Yes   Patient Concerns With Medical Bills self pay   Medical Bill Referrals: Medicaid referral to [facility], provided CAFA and will assist as needed moving forward.   Does Patient Have Prescription Coverage? Yes    Home Assistive Devices/Equipment None       Social History:                                                                             SDOH Screenings   Food Insecurity: No Food Insecurity (10/18/2023)  Housing: Low Risk  (10/18/2023)  Transportation Needs: No Transportation Needs (10/18/2023)  Utilities: Not At Risk (03/08/2023)  Depression (PHQ2-9): Low Risk  (07/29/2023)  Financial Resource Strain: Medium Risk (10/18/2023)  Tobacco Use: High Risk (10/18/2023)  Health Literacy: Adequate Health Literacy (10/18/2023)    SDOH Interventions: Financial Resources:  Financial Strain Interventions: Artist, Other (Comment) (referral for Medicaid  application and if not eligible then provided her with [facility] Financial Assistance application)  Food Insecurity:  Food Insecurity Interventions: Intervention Not Indicated  Housing Insecurity:  Housing Interventions: Intervention Not Indicated  Transportation:   Transportation Interventions: Intervention Not Indicated     Other Care Navigation Interventions:     Provided Pharmacy assistance resources  We discussed if not eligible for Medicaid and additional resources needed we can find assistance as needed   Follow-up plan:   LCSW was able to email caseworker Primitivo Brooke at [facility] and provide warm hand off. Received f/u email securely from her letting me know the following: "Good news, Cheryl Crane DOB [birth year] has an active Medicaid case approved last week. The application came in through the [employer] and she should get the notice soon but we can print her a copy of it.  For your records, her Medicaid ID# is [id number] and she was approved 10/06/23 - 10/04/24 plus retro 07/09/23 - 10/05/23."  I will reach out to financial counselor to see if this can be added to her chart and accounts rebilled to her Medicaid.  Will still mail my card to pt for any needs moving forward.

## 2023-10-18 NOTE — Patient Instructions (Addendum)
 Medication Instructions:  Your physician recommends that you continue on your current medications as directed. Please refer to the Current Medication list given to you today. *If you need a refill on your cardiac medications before your next appointment, please call your pharmacy*  Lab Work: [facility] If you have labs (blood work) drawn today and your tests are completely normal, you will receive your results only by: MyChart Message (if you have MyChart) OR A paper copy in the mail If you have any lab test that is abnormal or we need to change your treatment, we will call you to review the results.  Testing/Procedures: NONE ORDERED  Follow-Up: [facility] you and your health needs are our priority.  As part of our continuing mission to provide you with exceptional heart care, our providers are all part of one team.  This team includes your primary Cardiologist (physician) and Advanced Practice Providers or APPs (Physician Assistants and Nurse Practitioners) who all work together to provide you with the care you need, when you need it.  Your next appointment:   FOLLOW UP AS NEEDED   Provider:   Cody Das, MD    We recommend signing up for the patient portal called "MyChart".  Sign up information is provided on this After Visit Summary.  MyChart is used to connect with patients for Virtual Visits (Telemedicine).  Patients are able to view lab/test results, encounter notes, upcoming appointments, etc.  Non-urgent messages can be sent to your provider as well.   To learn more about what you can do with MyChart, go to [email or web address].   Other Instructions 1-800-QUIT-NOW

## 2023-10-19 ENCOUNTER — Ambulatory Visit: Payer: Self-pay | Admitting: Nurse Practitioner

## 2023-10-19 LAB — HEMOGLOBIN A1C
Est. average glucose Bld gHb Est-mCnc: 123 mg/dL
Hgb A1c MFr Bld: 5.9 % — ABNORMAL HIGH (ref 4.8–5.6)

## 2023-10-20 ENCOUNTER — Telehealth: Payer: Self-pay

## 2023-10-20 ENCOUNTER — Other Ambulatory Visit (HOSPITAL_COMMUNITY): Payer: Self-pay

## 2023-10-20 ENCOUNTER — Telehealth (HOSPITAL_BASED_OUTPATIENT_CLINIC_OR_DEPARTMENT_OTHER): Payer: Self-pay | Admitting: Licensed Clinical Social Worker

## 2023-10-20 NOTE — Telephone Encounter (Signed)
 Located pharmacy benefit information, see separate encounter

## 2023-10-20 NOTE — Telephone Encounter (Signed)
 Pharmacy Patient Advocate Encounter  Insurance verification completed.   The patient is insured through [facility] MEDICAID   RXBIN: [id number]  PCN: [id number]  EAV:[id number]  ID: [id number]

## 2023-10-20 NOTE — Telephone Encounter (Signed)
 H&V Care Navigation CSW Progress Note  Clinical Social Worker contacted patient by phone to f/u on Medicaid. Left a message at [phone]. This was returned by pt and she confirms she went to [facility] and they printed off her benefits. She is looking for her card, shared it appears she had a previous address Nurse, mental health) listed on [facility] benefits. She has access to that mail still- also was informed of how to update her address with DSS if needed.   No additional questions at this time, was encouraged to keep my number and card should any additional concerns arise moving forward.   Also routing this information to [facility]istance team to add to her account.   Patient is participating in a Managed Medicaid Plan:  Yes- [employer] Medicaid  SDOH Screenings   Food Insecurity: No Food Insecurity (10/18/2023)  Housing: Low Risk  (10/18/2023)  Transportation Needs: No Transportation Needs (10/18/2023)  Utilities: Not At Risk (03/08/2023)  Depression (PHQ2-9): Low Risk  (07/29/2023)  Financial Resource Strain: Medium Risk (10/18/2023)  Tobacco Use: High Risk (10/18/2023)  Health Literacy: Adequate Health Literacy (10/18/2023)    Nathen Balder, MSW, LCSW Clinical Social Worker II [facility] Heart/Vascular Care Navigation  [phone]- work cell phone (preferred)

## 2023-10-28 ENCOUNTER — Encounter: Payer: Self-pay | Admitting: Nurse Practitioner

## 2023-10-28 ENCOUNTER — Ambulatory Visit (INDEPENDENT_AMBULATORY_CARE_PROVIDER_SITE_OTHER): Payer: Self-pay | Admitting: Nurse Practitioner

## 2023-10-28 VITALS — BP 114/57 | HR 84 | Temp 97.6°F | Ht 62.0 in | Wt 236.0 lb

## 2023-10-28 DIAGNOSIS — Z720 Tobacco use: Secondary | ICD-10-CM

## 2023-10-28 DIAGNOSIS — R630 Anorexia: Secondary | ICD-10-CM

## 2023-10-28 DIAGNOSIS — N939 Abnormal uterine and vaginal bleeding, unspecified: Secondary | ICD-10-CM

## 2023-10-28 DIAGNOSIS — R7989 Other specified abnormal findings of blood chemistry: Secondary | ICD-10-CM

## 2023-10-28 DIAGNOSIS — Z1321 Encounter for screening for nutritional disorder: Secondary | ICD-10-CM | POA: Insufficient documentation

## 2023-10-28 DIAGNOSIS — E66813 Obesity, class 3: Secondary | ICD-10-CM | POA: Diagnosis not present

## 2023-10-28 DIAGNOSIS — R7303 Prediabetes: Secondary | ICD-10-CM

## 2023-10-28 DIAGNOSIS — R5383 Other fatigue: Secondary | ICD-10-CM | POA: Insufficient documentation

## 2023-10-28 MED ORDER — NICOTINE 7 MG/24HR TD PT24: 7.0000 mg | MEDICATED_PATCH | Freq: Every day | TRANSDERMAL | 0 refills | Status: AC

## 2023-10-28 NOTE — Progress Notes (Addendum)
 Established Patient Office Visit  Subjective:  Patient ID: Cheryl Crane, female    DOB: 09-23-1985  Age: 38 y.o. MRN: [id number]  CC:  Chief Complaint  Patient presents with   Obesity    HPI Cheryl Crane is a 39 y.o. female  has a past medical history of Abnormal uterine bleeding (03/09/2023), Asthma, Asthma, chronic (06/08/2015), Bronchitis, Fibroid uterus (07/31/2012), Menorrhagia with regular cycle (12/10/2021), Obesity (BMI 30-39.9) (10/25/2021), Symptomatic anemia (06/08/2015), Thrombocytosis (03/08/2023), and Tobacco use (10/25/2021).  Patient presents for follow-up for her chronic medical conditions  Obesity.  States that she does some cardio exercises some days, eats 1 meal daily due to chronic loss of appetite but not losing weight  Tobacco use disorder.  Stated that she quit smoking over a month ago but still has cravings for cigarettes.    Abnormal uterine bleeding.  Currently well-controlled on current examination as stated as needed     Past Medical History:  Diagnosis Date   Abnormal uterine bleeding 03/09/2023   Asthma    Asthma, chronic 06/08/2015   Bronchitis    Fibroid uterus 07/31/2012   3 seen.. All about 6cm     Menorrhagia with regular cycle 12/10/2021   Obesity (BMI 30-39.9) 10/25/2021   Symptomatic anemia 06/08/2015   Thrombocytosis 03/08/2023   Tobacco use 10/25/2021    History reviewed. No pertinent surgical history.  Family History  Problem Relation Age of Onset   Diabetes Mother    Breast cancer Mother    Fibroids Mother    Heart disease Father    Cancer - Lung Father    Fibroids Maternal Grandfather     Social History   Socioeconomic History   Marital status: Single    Spouse name: Not on file   Number of children: Not on file   Years of education: Not on file   Highest education level: Not on file  Occupational History   Not on file  Tobacco Use   Smoking status: Former    Current packs/day: 0.15    Types: Cigarettes    Smokeless tobacco: Not on file  Vaping Use   Vaping status: Never Used  Substance and Sexual Activity   Alcohol use: Yes    Comment: occasional   Drug use: No   Sexual activity: Yes  Other Topics Concern   Not on file  Social History Narrative   Lives with her grandmother    Social Drivers of Health   Financial Resource Strain: Medium Risk (10/18/2023)   Overall Financial Resource Strain (CARDIA)    Difficulty of Paying Living Expenses: Somewhat hard  Food Insecurity: No Food Insecurity (10/18/2023)   Hunger Vital Sign    Worried About Running Out of Food in the Last Year: Never true    Ran Out of Food in the Last Year: Never true  Transportation Needs: No Transportation Needs (10/18/2023)   PRAPARE - Administrator, Civil Service (Medical): No    Lack of Transportation (Non-Medical): No  Physical Activity: Not on file  Stress: Not on file  Social Connections: Not on file  Intimate Partner Violence: Not At Risk (03/08/2023)   Humiliation, Afraid, Rape, and Kick questionnaire    Fear of Current or Ex-Partner: No    Emotionally Abused: No    Physically Abused: No    Sexually Abused: No    Outpatient Medications Prior to Visit  Medication Sig Dispense Refill   acetaminophen  (TYLENOL ) 500 MG tablet Take 1,000 mg by mouth  every 6 (six) hours as needed for mild pain.     albuterol  (VENTOLIN  HFA) 108 (90 Base) MCG/ACT inhaler Inhale 2 puffs into the lungs every 6 (six) hours as needed for wheezing or shortness of breath.     tranexamic acid  (LYSTEDA ) 650 MG TABS tablet Take 2 tablets (1,300 mg total) by mouth 3 (three) times daily. Take during menses for a maximum of five days 30 tablet 2   ferrous sulfate  325 (65 FE) MG tablet Take 1 tablet (325 mg total) by mouth 2 (two) times daily with a meal. (Patient not taking: Reported on 10/28/2023) 60 tablet 0   No facility-administered medications prior to visit.    No Known Allergies  ROS Review of Systems   Constitutional:  Positive for appetite change. Negative for chills, fatigue and fever.  HENT:  Negative for congestion, postnasal drip, rhinorrhea and sneezing.   Respiratory:  Negative for cough, shortness of breath and wheezing.   Cardiovascular:  Negative for chest pain, palpitations and leg swelling.  Gastrointestinal:  Negative for abdominal pain, constipation, nausea and vomiting.  Genitourinary:  Positive for menstrual problem. Negative for difficulty urinating, dysuria, flank pain and frequency.  Musculoskeletal:  Negative for arthralgias, back pain, joint swelling and myalgias.  Skin:  Negative for color change, pallor, rash and wound.  Neurological:  Negative for dizziness, facial asymmetry, weakness, numbness and headaches.  Psychiatric/Behavioral:  Negative for behavioral problems, confusion, self-injury and suicidal ideas.       Objective:     Physical Exam Vitals and nursing note reviewed.  Constitutional:      General: She is not in acute distress.    Appearance: Normal appearance. She is obese. She is not ill-appearing, toxic-appearing or diaphoretic.  Eyes:     General: No scleral icterus.       Right eye: No discharge.        Left eye: No discharge.     Extraocular Movements: Extraocular movements intact.     Conjunctiva/sclera: Conjunctivae normal.  Cardiovascular:     Rate and Rhythm: Normal rate and regular rhythm.     Pulses: Normal pulses.     Heart sounds: Normal heart sounds. No murmur heard.    No friction rub. No gallop.  Pulmonary:     Effort: Pulmonary effort is normal. No respiratory distress.     Breath sounds: Normal breath sounds. No stridor. No wheezing, rhonchi or rales.  Chest:     Chest wall: No tenderness.  Abdominal:     General: There is no distension.     Palpations: Abdomen is soft.     Tenderness: There is no abdominal tenderness. There is no right CVA tenderness, left CVA tenderness or guarding.  Musculoskeletal:        General:  No swelling, tenderness, deformity or signs of injury.     Right lower leg: No edema.     Left lower leg: No edema.  Skin:    General: Skin is warm and dry.     Capillary Refill: Capillary refill takes less than 2 seconds.     Coloration: Skin is not jaundiced or pale.     Findings: No bruising, erythema or lesion.  Neurological:     Mental Status: She is alert and oriented to person, place, and time.     Motor: No weakness.     Gait: Gait normal.  Psychiatric:        Mood and Affect: Mood normal.  Behavior: Behavior normal.        Thought Content: Thought content normal.        Judgment: Judgment normal.     BP (!) 114/57   Pulse 84   Temp 97.6 F (36.4 C)   Ht 5\' 2"  (1.575 m)   Wt 236 lb (107 kg)   SpO2 98%   BMI 43.16 kg/m  Wt Readings from Last 3 Encounters:  10/28/23 236 lb (107 kg)  10/18/23 235 lb 6.4 oz (106.8 kg)  07/29/23 231 lb (104.8 kg)    Lab Results  Component Value Date   TSH 4.691 (H) 10/26/2021   Lab Results  Component Value Date   WBC 4.2 07/29/2023   HGB 10.3 (L) 07/29/2023   HCT 35.2 07/29/2023   MCV 78 (L) 07/29/2023   PLT 487 (H) 07/29/2023   Lab Results  Component Value Date   NA 141 07/29/2023   K 4.9 07/29/2023   CO2 23 07/29/2023   GLUCOSE 86 07/29/2023   BUN 8 07/29/2023   CREATININE 0.56 (L) 07/29/2023   BILITOT 0.4 07/29/2023   ALKPHOS 113 07/29/2023   AST 20 07/29/2023   ALT 21 07/29/2023   PROT 7.4 07/29/2023   ALBUMIN 4.6 07/29/2023   CALCIUM 9.6 07/29/2023   ANIONGAP 8 03/09/2023   EGFR 120 07/29/2023   Lab Results  Component Value Date   CHOL 171 07/18/2023   Lab Results  Component Value Date   HDL 33 (L) 07/18/2023   Lab Results  Component Value Date   LDLCALC 103 (H) 07/18/2023   Lab Results  Component Value Date   TRIG 200 (H) 07/18/2023   Lab Results  Component Value Date   CHOLHDL 5.2 (H) 07/18/2023   Lab Results  Component Value Date   HGBA1C 5.9 (H) 10/18/2023      Assessment &  Plan:   Problem List Items Addressed This Visit       Genitourinary   Abnormal uterine bleeding - Primary   Controlled with tranexamic acid  as needed Checking CBC, iron  panel      Relevant Orders   CBC   Iron , TIBC and Ferritin Panel     Other   Tobacco use    - nicotine (NICODERM CQ - DOSED IN MG/24 HR) 7 mg/24hr patch; Place 1 patch (7 mg total) onto the skin daily.  Dispense: 28 patch; Refill: 0       Relevant Medications   nicotine (NICODERM CQ - DOSED IN MG/24 HR) 7 mg/24hr patch   Class 3 obesity   Wt Readings from Last 3 Encounters:  10/28/23 236 lb (107 kg)  10/18/23 235 lb 6.4 oz (106.8 kg)  07/29/23 231 lb (104.8 kg)   Body mass index is 43.16 kg/m.  Patient counseled on low-carb high-protein diet Encouraged to engage in regular moderate to vigorous exercises at least 150 minutes weekly as tolerated We talked about medical treatments for weight loss but she would like to make lifestyle changes Has upcoming appointment with the medical weight management specialist in July       Relevant Orders   TSH + free T4   Fatigue   Lab Results  Component Value Date   WBC 4.2 07/29/2023   HGB 10.3 (L) 07/29/2023   HCT 35.2 07/29/2023   MCV 78 (L) 07/29/2023   PLT 487 (H) 07/29/2023  Checking CBC and iron  panel -Vitamin D      Relevant Orders   CBC   Iron , TIBC and Ferritin  Panel   Prediabetes   Lab Results  Component Value Date   HGBA1C 5.9 (H) 10/18/2023  Patient counseled on low-carb diet Encouraged to lose weight      Elevated TSH   Lab Results  Component Value Date   TSH 4.691 (H) 10/26/2021  check TSH T4 levels      Relevant Orders   TSH + free T4   Poor appetite   Encouraged to eat 3 healthy meals rich in protein and low-carb daily - Iron , TIBC and Ferritin Panel - VITAMIN D 25 Hydroxy (Vit-D Deficiency, Fractures) - TSH + free T4       Relevant Orders   Iron , TIBC and Ferritin Panel   VITAMIN D 25 Hydroxy (Vit-D Deficiency,  Fractures)   TSH + free T4   Encounter for vitamin deficiency screening   Checking vitamin D levels      Relevant Orders   VITAMIN D 25 Hydroxy (Vit-D Deficiency, Fractures)    Meds ordered this encounter  Medications   nicotine (NICODERM CQ - DOSED IN MG/24 HR) 7 mg/24hr patch    Sig: Place 1 patch (7 mg total) onto the skin daily.    Dispense:  28 patch    Refill:  0    Follow-up: Return in about 6 months (around 04/29/2024), or obesity.    Teodoro Jeffreys R Joell Buerger, FNP

## 2023-10-28 NOTE — Assessment & Plan Note (Signed)
Checking vitamin D levels.

## 2023-10-28 NOTE — Assessment & Plan Note (Addendum)
 Lab Results  Component Value Date   TSH 4.691 (H) 10/26/2021  check TSH T4 levels

## 2023-10-28 NOTE — Assessment & Plan Note (Signed)
 Encouraged to eat 3 healthy meals rich in protein and low-carb daily - Iron , TIBC and Ferritin Panel - VITAMIN D 25 Hydroxy (Vit-D Deficiency, Fractures) - TSH + free T4

## 2023-10-28 NOTE — Assessment & Plan Note (Addendum)
 Wt Readings from Last 3 Encounters:  10/28/23 236 lb (107 kg)  10/18/23 235 lb 6.4 oz (106.8 kg)  07/29/23 231 lb (104.8 kg)   Body mass index is 43.16 kg/m.  Patient counseled on low-carb high-protein diet Encouraged to engage in regular moderate to vigorous exercises at least 150 minutes weekly as tolerated We talked about medical treatments for weight loss but she would like to make lifestyle changes Has upcoming appointment with the medical weight management specialist in July

## 2023-10-28 NOTE — Assessment & Plan Note (Signed)
 Lab Results  Component Value Date   WBC 4.2 07/29/2023   HGB 10.3 (L) 07/29/2023   HCT 35.2 07/29/2023   MCV 78 (L) 07/29/2023   PLT 487 (H) 07/29/2023

## 2023-10-28 NOTE — Assessment & Plan Note (Signed)
 Lab Results  Component Value Date   HGBA1C 5.9 (H) 10/18/2023  Patient counseled on low-carb diet Encouraged to lose weight

## 2023-10-28 NOTE — Patient Instructions (Signed)
 1. Symptomatic anemia (Primary)  - CBC - Iron , TIBC and Ferritin Panel  2. Class 3 obesity   3. Encounter for vitamin deficiency screening  - VITAMIN D 25 Hydroxy (Vit-D Deficiency, Fractures)    It is important that you exercise regularly at least 30 minutes 5 times a week as tolerated  Think about what you will eat, plan ahead. Choose " clean, green, fresh or frozen" over canned, processed or packaged foods which are more sugary, salty and fatty. 70 to 75% of food eaten should be vegetables and fruit. Three meals at set times with snacks allowed between meals, but they must be fruit or vegetables. Aim to eat over a 12 hour period , example 7 am to 7 pm, and STOP after  your last meal of the day. Drink water,generally about 64 ounces per day, no other drink is as healthy. Fruit juice is best enjoyed in a healthy way, by EATING the fruit.  Thanks for choosing [facility] we consider it a privelige to serve you.

## 2023-10-28 NOTE — Assessment & Plan Note (Signed)
-   nicotine (NICODERM CQ - DOSED IN MG/24 HR) 7 mg/24hr patch; Place 1 patch (7 mg total) onto the skin daily.  Dispense: 28 patch; Refill: 0

## 2023-10-28 NOTE — Assessment & Plan Note (Signed)
 Controlled with tranexamic acid  as needed Checking CBC, iron  panel

## 2023-10-28 NOTE — Assessment & Plan Note (Signed)
 Lab Results  Component Value Date   WBC 4.2 07/29/2023   HGB 10.3 (L) 07/29/2023   HCT 35.2 07/29/2023   MCV 78 (L) 07/29/2023   PLT 487 (H) 07/29/2023  Checking CBC and iron  panel -Vitamin D

## 2023-10-29 LAB — IRON,TIBC AND FERRITIN PANEL
Ferritin: 3 ng/mL — ABNORMAL LOW (ref 15–150)
Iron Saturation: 2 % — CL (ref 15–55)
Iron: 9 ug/dL — CL (ref 27–159)
Total Iron Binding Capacity: 506 ug/dL — ABNORMAL HIGH (ref 250–450)
UIBC: 497 ug/dL — ABNORMAL HIGH (ref 131–425)

## 2023-10-29 LAB — CBC
Hematocrit: 23.8 % — ABNORMAL LOW (ref 34.0–46.6)
Hemoglobin: 6.1 g/dL — CL (ref 11.1–15.9)
MCH: 16 pg — ABNORMAL LOW (ref 26.6–33.0)
MCHC: 25.6 g/dL — ABNORMAL LOW (ref 31.5–35.7)
MCV: 63 fL — ABNORMAL LOW (ref 79–97)
Platelets: 450 10*3/uL (ref 150–450)
RBC: 3.81 x10E6/uL (ref 3.77–5.28)
RDW: 19.8 % — ABNORMAL HIGH (ref 11.7–15.4)
WBC: 4.7 10*3/uL (ref 3.4–10.8)

## 2023-10-29 LAB — VITAMIN D 25 HYDROXY (VIT D DEFICIENCY, FRACTURES): Vit D, 25-Hydroxy: 7.4 ng/mL — ABNORMAL LOW (ref 30.0–100.0)

## 2023-10-29 LAB — TSH+FREE T4
Free T4: 1.19 ng/dL (ref 0.82–1.77)
TSH: 2.63 u[IU]/mL (ref 0.450–4.500)

## 2023-11-01 ENCOUNTER — Ambulatory Visit: Payer: Self-pay | Admitting: Nurse Practitioner

## 2023-11-01 ENCOUNTER — Other Ambulatory Visit: Payer: Self-pay | Admitting: Nurse Practitioner

## 2023-11-01 DIAGNOSIS — E559 Vitamin D deficiency, unspecified: Secondary | ICD-10-CM

## 2023-11-01 DIAGNOSIS — D649 Anemia, unspecified: Secondary | ICD-10-CM

## 2023-11-01 MED ORDER — FERROUS SULFATE 325 (65 FE) MG PO TABS: 325.0000 mg | ORAL_TABLET | Freq: Two times a day (BID) | ORAL | 1 refills | Status: DC

## 2023-11-01 MED ORDER — VITAMIN D (ERGOCALCIFEROL) 1.25 MG (50000 UNIT) PO CAPS: 50000.0000 [IU] | ORAL_CAPSULE | ORAL | 0 refills | Status: DC

## 2023-11-08 ENCOUNTER — Telehealth: Payer: Self-pay | Admitting: Licensed Clinical Social Worker

## 2023-11-08 NOTE — Telephone Encounter (Signed)
 H&V Care Navigation CSW Progress Note  Clinical Social Worker received call from pt to inquire about what to do since she has not received any cards from [facility] yet and it has been several weeks since approval. LCSW reached out to Primitivo Brooke with [place] [employer] to request that she confirm address on file with pt and update if needed.  Patient is participating in a Managed Medicaid Plan:  [facility] Medicaid  SDOH Screenings   Food Insecurity: No Food Insecurity (10/18/2023)  Housing: Low Risk  (10/18/2023)  Transportation Needs: No Transportation Needs (10/18/2023)  Utilities: Not At Risk (03/08/2023)  Depression (PHQ2-9): Low Risk  (10/28/2023)  Financial Resource Strain: Medium Risk (10/18/2023)  Tobacco Use: Medium Risk (10/28/2023)  Health Literacy: Adequate Health Literacy (10/18/2023)    Nathen Balder, MSW, LCSW Clinical Social Worker II [facility] Heart/Vascular Care Navigation  [phone]- work cell phone (preferred)

## 2023-11-23 ENCOUNTER — Other Ambulatory Visit: Payer: Self-pay | Admitting: Nurse Practitioner

## 2023-11-23 DIAGNOSIS — D649 Anemia, unspecified: Secondary | ICD-10-CM

## 2023-11-29 ENCOUNTER — Other Ambulatory Visit: Payer: Self-pay

## 2023-11-29 ENCOUNTER — Encounter (HOSPITAL_BASED_OUTPATIENT_CLINIC_OR_DEPARTMENT_OTHER): Payer: Self-pay | Admitting: Emergency Medicine

## 2023-11-29 ENCOUNTER — Emergency Department (HOSPITAL_BASED_OUTPATIENT_CLINIC_OR_DEPARTMENT_OTHER)
Admission: EM | Admit: 2023-11-29 | Discharge: 2023-11-29 | Disposition: A | Discharge status: Home / Self Care | Attending: Emergency Medicine | Admitting: Emergency Medicine

## 2023-11-29 DIAGNOSIS — J45909 Unspecified asthma, uncomplicated: Secondary | ICD-10-CM | POA: Diagnosis not present

## 2023-11-29 DIAGNOSIS — Z720 Tobacco use: Secondary | ICD-10-CM | POA: Diagnosis not present

## 2023-11-29 DIAGNOSIS — D509 Iron deficiency anemia, unspecified: Secondary | ICD-10-CM | POA: Insufficient documentation

## 2023-11-29 DIAGNOSIS — K0889 Other specified disorders of teeth and supporting structures: Secondary | ICD-10-CM | POA: Diagnosis present

## 2023-11-29 LAB — CBC WITH DIFFERENTIAL/PLATELET
Abs Immature Granulocytes: 0.01 10*3/uL (ref 0.00–0.07)
Basophils Absolute: 0.1 10*3/uL (ref 0.0–0.1)
Basophils Relative: 1 %
Eosinophils Absolute: 0.1 10*3/uL (ref 0.0–0.5)
Eosinophils Relative: 2 %
HCT: 30.6 % — ABNORMAL LOW (ref 36.0–46.0)
Hemoglobin: 8.2 g/dL — ABNORMAL LOW (ref 12.0–15.0)
Immature Granulocytes: 0 %
Lymphocytes Relative: 34 %
Lymphs Abs: 1.6 10*3/uL (ref 0.7–4.0)
MCH: 18.5 pg — ABNORMAL LOW (ref 26.0–34.0)
MCHC: 26.8 g/dL — ABNORMAL LOW (ref 30.0–36.0)
MCV: 69.1 fL — ABNORMAL LOW (ref 80.0–100.0)
Monocytes Absolute: 0.4 10*3/uL (ref 0.1–1.0)
Monocytes Relative: 8 %
Neutro Abs: 2.6 10*3/uL (ref 1.7–7.7)
Neutrophils Relative %: 55 %
Platelets: 487 10*3/uL — ABNORMAL HIGH (ref 150–400)
RBC: 4.43 MIL/uL (ref 3.87–5.11)
RDW: 30.8 % — ABNORMAL HIGH (ref 11.5–15.5)
WBC: 4.7 10*3/uL (ref 4.0–10.5)
nRBC: 0 % (ref 0.0–0.2)

## 2023-11-29 LAB — BASIC METABOLIC PANEL WITH GFR
Anion gap: 11 (ref 5–15)
BUN: 9 mg/dL (ref 6–20)
CO2: 22 mmol/L (ref 22–32)
Calcium: 9.6 mg/dL (ref 8.9–10.3)
Chloride: 107 mmol/L (ref 98–111)
Creatinine, Ser: 0.89 mg/dL (ref 0.44–1.00)
GFR, Estimated: >60 mL/min (ref >60)
Glucose, Bld: 100 mg/dL — ABNORMAL HIGH (ref 70–99)
Potassium: 4.2 mmol/L (ref 3.5–5.1)
Sodium: 140 mmol/L (ref 135–145)

## 2023-11-29 MED ORDER — AMOXICILLIN-POT CLAVULANATE 875-125 MG PO TABS: 1.0000 | ORAL_TABLET | Freq: Two times a day (BID) | ORAL | 0 refills | Status: DC

## 2023-11-29 MED ORDER — IBUPROFEN 800 MG PO TABS: 800.0000 mg | ORAL_TABLET | Freq: Three times a day (TID) | ORAL | 0 refills | Status: DC | PRN

## 2023-11-29 MED ORDER — OXYCODONE-ACETAMINOPHEN 5-325 MG PO TABS
1.0000 | ORAL_TABLET | Freq: Once | ORAL | Status: AC
  Administered 2023-11-29: 1 via ORAL
  Filled 2023-11-29: qty 1

## 2023-11-29 NOTE — ED Triage Notes (Signed)
 Pt caox4, ambulatory c/o dental pain in L side of mouth, unable to specify if upper or lower x2 days. Last had tylenol  at 0800 with no relief.

## 2023-11-29 NOTE — ED Notes (Signed)
 RN reviewed discharge instructions with pt. Pt verbalized understanding and had no further questions. VSS upon discharge.

## 2023-11-29 NOTE — Discharge Instructions (Addendum)
 It was a pleasure taking care of you today. Based off your history, physical exam, and labs I feel you are safe for discharge. Please follow-up with a dentist as soon as possible for your dental pain.  Today you have been given 2 prescriptions, one for an anti-inflammatory medication to help with pain, and another for antibiotics.  Please take the anti-inflammatory medication as needed for pain, and complete the entire course of antibiotics.  Please return the emergency department or seek further medical care if experiencing the following symptoms including but not limited to fever, chills, severe pain, chest pain, shortness of breath, confusion, weakness, visual disturbances.  Attached is a list of local dentist options, please make an appointment with any of these as soon as possible for follow-up.  Your hemoglobin was also decreased, please follow-up with your primary care provider regarding this and recommend lab recheck in 1 week.

## 2023-11-29 NOTE — ED Provider Notes (Signed)
 [place] EMERGENCY DEPARTMENT AT [facility] Provider Note   CSN: [id number] Arrival date & time: 11/29/23  1734     Patient presents with: Dental Pain   Cheryl Crane is a 38 y.o. female who presents the emergency department with a chief complaint of dental pain.  Patient states that her pain started last week but was off and on and improved with Tylenol .  Patient states that now her pain is constant and that Tylenol  is no longer working.  Patient states that she has tried to reach out to a dentist but the earliest appointment that she could get so far is July 21.  Patient states that at home she has felt like she may have had fever and chills.  Denies chest pain, shortness of breath, throat swelling, tongue swelling, issues swallowing or breathing.  Patient states that she has had numerous dental issues in the past, with many of her teeth being cracked or damaged.  Past medical history significant for asthma, tobacco use.  {Add pertinent medical, surgical, social history, OB history to YEP:67052}  Dental Pain      Prior to Admission medications   Medication Sig Start Date End Date Taking? Authorizing Provider  acetaminophen  (TYLENOL ) 500 MG tablet Take 1,000 mg by mouth every 6 (six) hours as needed for mild pain.    [provider]  albuterol  (VENTOLIN  HFA) 108 (90 Base) MCG/ACT inhaler Inhale 2 puffs into the lungs every 6 (six) hours as needed for wheezing or shortness of breath.    [provider]  ferrous sulfate  325 (65 FE) MG tablet TAKE 1 TABLET BY MOUTH 2 TIMES DAILY WITH A MEAL. 11/23/23   Paseda, Folashade R, FNP  nicotine  (NICODERM CQ  - DOSED IN MG/24 HR) 7 mg/24hr patch Place 1 patch (7 mg total) onto the skin daily. 10/28/23   Paseda, Folashade R, FNP  tranexamic acid  (LYSTEDA ) 650 MG TABS tablet Take 2 tablets (1,300 mg total) by mouth 3 (three) times daily. Take during menses for a maximum of five days 09/30/23   Paseda, Folashade R, FNP   Vitamin D , Ergocalciferol , (DRISDOL ) 1.25 MG (50000 UNIT) CAPS capsule Take 1 capsule (50,000 Units total) by mouth every 7 (seven) days. 11/01/23   Paseda, Folashade R, FNP    Allergies: Patient has no known allergies.    Review of Systems  HENT:         Dental pain    Updated Vital Signs BP (!) 159/94 (BP Location: Right Arm)   Pulse 89   Temp (!) 97.5 F (36.4 C)   Resp 19   Ht 5' 2 (1.575 m)   Wt 104.3 kg   SpO2 98%   BMI 42.07 kg/m   Physical Exam Vitals and nursing note reviewed.  Constitutional:      General: She is awake. She is not in acute distress.    Appearance: Normal appearance. She is obese. She is not ill-appearing, toxic-appearing or diaphoretic.  HENT:     Head: Normocephalic and atraumatic.     Mouth/Throat:     Mouth: Mucous membranes are moist.     Pharynx: No oropharyngeal exudate or posterior oropharyngeal erythema.     Comments: Mild swelling possible to anterior gums on the left side, no obvious redness, no drainage, no obvious abscess no sign of peritonsillar abscess, uvula not deviated, tongue nonswollen  Eyes:     General: No scleral icterus.    Extraocular Movements: Extraocular movements intact.   Pulmonary:  Effort: Pulmonary effort is normal. No respiratory distress.   Musculoskeletal:        General: Normal range of motion.     Cervical back: Normal range of motion. No tenderness.  Lymphadenopathy:     Cervical: No cervical adenopathy.   Skin:    General: Skin is warm and dry.     Capillary Refill: Capillary refill takes less than 2 seconds.   Neurological:     General: No focal deficit present.     Mental Status: She is alert and oriented to person, place, and time.     Motor: No weakness.     Gait: Gait normal.   Psychiatric:        Mood and Affect: Mood normal.        Behavior: Behavior normal. Behavior is cooperative.     (all labs ordered are listed, but only abnormal results are displayed) Labs Reviewed  CBC  WITH DIFFERENTIAL/PLATELET  BASIC METABOLIC PANEL WITH GFR    EKG: None  Radiology: No results found.  {Document cardiac monitor, telemetry assessment procedure when appropriate:32947} Procedures   Medications Ordered in the ED  oxyCODONE -acetaminophen  (PERCOCET/ROXICET) 5-325 MG per tablet 1 tablet (has no administration in time range)      {Click here for ABCD2, HEART and other calculators REFRESH Note before signing:1}                              Medical Decision Making Amount and/or Complexity of Data Reviewed Labs: ordered.  Risk Prescription drug management.   Patient presents to the ED for concern of dental pain, this involves an extensive number of treatment options, and is a complaint that carries with it a high risk of complications and morbidity.  The differential diagnosis includes peritonsillar abscess, angioedema, foreign body, tooth infection, trauma/injury, parotitis, etc.   Co morbidities that complicate the patient evaluation  None   Lab Tests:  I Ordered, and personally interpreted labs.  The pertinent results include: CBC, BMP   Medicines ordered and prescription drug management:  I ordered medication including Percocet for pain  Reevaluation of the patient after these medicines showed that the patient improved I have reviewed the patients home medicines and have made adjustments as needed   Test Considered:  Imaging: declined at this time as patient does not have any red flag symptoms, no obvious swelling of left cheek, no obvious abscess on oral examination   Critical Interventions:  None   Problem List / ED Course:  38 year old female patient presents emergency department with a chief complaint of dental pain.  Patient states that pain started last week and that she has been trying to get into a dentist but release appointment is July 21 States that she may have had fever and chills at home Tylenol  used to work for pain but is now  not working anymore Denies red flag symptoms including shortness of breath, tongue swelling, throat swelling etc. On exam no uvular deviation, no tongue swelling, no sign of peritonsillar abscess, possible small abscess present on the left anterior gums Percocet given for pain here in the emergency department CBC and BMP ordered    Reevaluation:  After the interventions noted above, I reevaluated the patient and found that they have :{resolved/improved/worsened:23923::improved}   Social Determinants of Health:  ***   Dispostion:  After consideration of the diagnostic results and the patients response to treatment, I feel that the patent would benefit from ***.    {  Document critical care time when appropriate  Document review of labs and clinical decision tools ie CHADS2VASC2, etc  Document your independent review of radiology images and any outside records  Document your discussion with family members, caretakers and with consultants  Document social determinants of health affecting pt's care  Document your decision making why or why not admission, treatments were needed:32947:::1}   Final diagnoses:  None    ED Discharge Orders     None

## 2023-11-30 ENCOUNTER — Telehealth: Payer: Self-pay

## 2023-11-30 NOTE — Transitions of Care (Post Inpatient/ED Visit) (Signed)
   11/30/2023  Name: CLARIE CAMEY MRN: [id number] DOB: 05/13/1986  Today's TOC FU Call Status: Today's TOC FU Call Status:: Unsuccessful Call (1st Attempt) Unsuccessful Call (1st Attempt) Date: 11/30/23  Attempted to reach the patient regarding the most recent Inpatient/ED visit.  Follow Up Plan: Additional outreach attempts will be made to reach the patient to complete the Transitions of Care (Post Inpatient/ED visit) call.   Signature  [employer], [place]

## 2023-12-01 ENCOUNTER — Telehealth: Payer: Self-pay

## 2023-12-01 NOTE — Transitions of Care (Post Inpatient/ED Visit) (Signed)
   12/01/2023  Name: Cheryl Crane MRN: [id number] DOB: 17-Aug-1985  Today's TOC FU Call Status: Today's TOC FU Call Status:: Unsuccessful Call (2nd Attempt) Unsuccessful Call (2nd Attempt) Date: 12/01/23  Attempted to reach the patient regarding the most recent Inpatient/ED visit.  Follow Up Plan: Additional outreach attempts will be made to reach the patient to complete the Transitions of Care (Post Inpatient/ED visit) call.   Signature  [employer], [place]

## 2023-12-05 ENCOUNTER — Telehealth: Payer: Self-pay

## 2023-12-05 NOTE — Transitions of Care (Post Inpatient/ED Visit) (Unsigned)
   12/05/2023  Name: ORA BOLLIG MRN: [id number] DOB: 1986-06-02  Today's TOC FU Call Status: Today's TOC FU Call Status:: Unsuccessful Call (3rd Attempt) Unsuccessful Call (3rd Attempt) Date: 12/05/23  Attempted to reach the patient regarding the most recent Inpatient/ED visit.  Follow Up Plan: No further outreach attempts will be made at this time. We have been unable to contact the patient.  Signature  [employer], [place]

## 2023-12-12 ENCOUNTER — Institutional Professional Consult (permissible substitution) (INDEPENDENT_AMBULATORY_CARE_PROVIDER_SITE_OTHER): Payer: MEDICAID | Admitting: Internal Medicine

## 2023-12-24 ENCOUNTER — Other Ambulatory Visit: Payer: Self-pay | Admitting: Nurse Practitioner

## 2023-12-24 DIAGNOSIS — E559 Vitamin D deficiency, unspecified: Secondary | ICD-10-CM

## 2023-12-26 NOTE — Telephone Encounter (Signed)
 Please advise if you would like the pt to continue or have labs. Thank you. KH

## 2024-01-26 ENCOUNTER — Other Ambulatory Visit: Payer: Self-pay

## 2024-01-26 ENCOUNTER — Encounter: Payer: Self-pay | Admitting: Obstetrics and Gynecology

## 2024-01-26 ENCOUNTER — Encounter: Payer: Self-pay | Admitting: General Practice

## 2024-01-26 ENCOUNTER — Ambulatory Visit: Admitting: Obstetrics and Gynecology

## 2024-01-26 VITALS — BP 117/80 | HR 97 | Ht 64.0 in | Wt 233.0 lb

## 2024-01-26 DIAGNOSIS — D259 Leiomyoma of uterus, unspecified: Secondary | ICD-10-CM | POA: Diagnosis not present

## 2024-01-26 NOTE — Progress Notes (Signed)
 GYNECOLOGY OFFICE VISIT NOTE  History:  Cheryl Crane is a 38 y.o. G2P0020 here today for fibroids.    Discussed the use of AI scribe software for clinical note transcription with the patient, who gave verbal consent to proceed.  History of Present Illness Cheryl Crane is a 38 year old female with uterine fibroids who presents with worsening abdominal pressure and discomfort.  She experiences ongoing abdominal pressure and discomfort, describing it as feeling like 'extra fat' and noting that it has worsened over time. She has difficulty lying in certain positions due to the sensation of movement in her abdomen. Her history of uterine fibroids was previously imaged almost two years ago.  She is currently experiencing bleeding but is taking iron  supplements and has no symptoms associated with anemia. A previous biopsy did not yield sufficient tissue for analysis, and she recalls the procedure was uncomfortable and difficult due to the presence of fibroids.  She has decided against future childbearing, stating she feels 'too old' for more children. She expresses concern about cancer due to a family history and mentions her mother's advice to check for cancer again.     Past Medical History:  Diagnosis Date   Abnormal uterine bleeding 03/09/2023   Asthma    Asthma, chronic 06/08/2015   Bronchitis    Fibroid uterus 07/31/2012   3 seen.. All about 6cm     Menorrhagia with regular cycle 12/10/2021   Obesity (BMI 30-39.9) 10/25/2021   Symptomatic anemia 06/08/2015   Thrombocytosis 03/08/2023   Tobacco use 10/25/2021    No past surgical history on file.  The following portions of the patient's history were reviewed and updated as appropriate: allergies, current medications, past family history, past medical history, past social history, past surgical history and problem list.   Health Maintenance:   Normal pap and negative HRHPV: Diagnosis  Date Value Ref Range Status   04/25/2023   Final   - Negative for intraepithelial lesion or malignancy (NILM)   Review of Systems:  Pertinent items noted in HPI and remainder of comprehensive ROS otherwise negative.  Physical Exam:  BP 117/80   Pulse 97   Ht 5' 4 (1.626 m)   Wt 233 lb (105.7 kg)   LMP 01/19/2024 (Within Days)   Breastfeeding No   BMI 39.99 kg/m  CONSTITUTIONAL: Well-developed, well-nourished female in no acute distress.  HEENT:  Normocephalic, atraumatic. External right and left ear normal. No scleral icterus.  NECK: Normal range of motion, supple, no masses noted on observation SKIN: No rash noted. Not diaphoretic. No erythema. No pallor. MUSCULOSKELETAL: Normal range of motion. No edema noted. NEUROLOGIC: Alert and oriented to person, place, and time. Normal muscle tone coordination. No cranial nerve deficit noted. PSYCHIATRIC: Normal mood and affect. Normal behavior. Normal judgment and thought content.  PELVIC: Deferred  Labs and Imaging No results found for this or any previous visit (from the past week). No results found.  Assessment and Plan:  [place] was seen today for follow-up.  Diagnoses and all orders for this visit:  Uterine leiomyoma, unspecified location -     MR PELVIS W WO CONTRAST; Future -     Ambulatory referral to Interventional Radiology   Assessment and Plan Assessment & Plan Uterine fibroids Fibroids increased in size, causing significant pressure symptoms. She decided against future childbearing. - Order pelvic MRI to assess fibroid size and location. - Refer to interventional radiologist for uterine artery embolization consultation. - Consult Dr. Almedia for second opinion  on surgical options, including robotic surgery. - Plan endometrial biopsy post-MRI to rule out precancerous conditions. Plan will be to numb before the procedure. MRI will help establish uterine size and cavity direction.  - Schedule follow-up in one month with provider or Dr.  Almedia.  Anemia Anemia likely due to fibroids and associated bleeding. She is taking iron  supplements.     Routine preventative health maintenance measures emphasized. Please refer to After Visit Summary for other counseling recommendations.   Return in about 1 month (around 02/26/2024) for Follow up and EMB with me or Dr. Jeralyn! SABRA Vina Solian, MD, FACOG Obstetrician & Gynecologist, [facility] for [facility], [facility]

## 2024-02-02 ENCOUNTER — Ambulatory Visit (HOSPITAL_COMMUNITY)
Admission: RE | Admit: 2024-02-02 | Discharge: 2024-02-02 | Disposition: A | Discharge status: Home / Self Care | Source: Ambulatory Visit | Attending: Obstetrics and Gynecology | Admitting: Obstetrics and Gynecology

## 2024-02-02 DIAGNOSIS — D259 Leiomyoma of uterus, unspecified: Secondary | ICD-10-CM | POA: Diagnosis present

## 2024-02-02 MED ORDER — GADOBUTROL 1 MMOL/ML IV SOLN
10.0000 mL | Freq: Once | INTRAVENOUS | Status: AC | PRN
  Administered 2024-02-02: 10 mL via INTRAVENOUS

## 2024-02-10 ENCOUNTER — Ambulatory Visit: Payer: Self-pay | Admitting: Obstetrics and Gynecology

## 2024-02-14 ENCOUNTER — Other Ambulatory Visit: Payer: Self-pay

## 2024-02-14 ENCOUNTER — Encounter: Payer: Self-pay | Admitting: Obstetrics and Gynecology

## 2024-02-14 ENCOUNTER — Ambulatory Visit: Admitting: Obstetrics and Gynecology

## 2024-02-14 ENCOUNTER — Other Ambulatory Visit (HOSPITAL_COMMUNITY)
Admission: RE | Admit: 2024-02-14 | Discharge: 2024-02-14 | Disposition: A | Discharge status: Home / Self Care | Source: Ambulatory Visit | Attending: Obstetrics and Gynecology | Admitting: Obstetrics and Gynecology

## 2024-02-14 VITALS — BP 131/70 | HR 93 | Wt 237.3 lb

## 2024-02-14 DIAGNOSIS — N920 Excessive and frequent menstruation with regular cycle: Secondary | ICD-10-CM | POA: Diagnosis present

## 2024-02-14 DIAGNOSIS — Z3202 Encounter for pregnancy test, result negative: Secondary | ICD-10-CM

## 2024-02-14 DIAGNOSIS — Z1331 Encounter for screening for depression: Secondary | ICD-10-CM | POA: Diagnosis not present

## 2024-02-14 DIAGNOSIS — D259 Leiomyoma of uterus, unspecified: Secondary | ICD-10-CM

## 2024-02-14 LAB — POCT PREGNANCY, URINE: Preg Test, Ur: NEGATIVE

## 2024-02-14 MED ORDER — LUPRON DEPOT (3-MONTH) 11.25 MG IM KIT: 11.2500 mg | PACK | INTRAMUSCULAR | 1 refills | Status: AC

## 2024-02-14 NOTE — Progress Notes (Signed)
 GYNECOLOGY VISIT  Patient name: Cheryl Crane MRN [id number]  Date of birth: 03/10/1986 Chief Complaint:   Procedure  History:  Discussed the use of AI scribe software for clinical note transcription with the patient, who gave verbal consent to proceed.  History of Present Illness Cheryl Crane is a 38 year old female who presents for an endometrial biopsy.  She is undergoing an endometrial biopsy today after a previous attempt was unsuccessful due to discomfort and difficulty in obtaining the correct tissue sample, resulting in only cervical tissue being collected. She experiences significant anxiety and discomfort about the procedure and was initially reluctant to return for another attempt.  She is apprehensive about the use of a paracervical block, which involves an injection of numbing medication on either side of the cervix. She is particularly concerned about the cramping sensation associated with the procedure and fears the anticipated discomfort.  She is interested in lupron  as previously discussed to help manage her bleeding and fibroids. She inquires about the cost and coverage of Lupron  under her Medicaid plan.   The following portions of the patient's history were reviewed and updated as appropriate: allergies, current medications, past family history, past medical history, past social history, past surgical history and problem list.   Health Maintenance:   Last pap     Component Value Date/Time   DIAGPAP  04/25/2023 1148    - Negative for intraepithelial lesion or malignancy (NILM)   HPVHIGH Negative 04/25/2023 1148   ADEQPAP  04/25/2023 1148    Satisfactory for evaluation; transformation zone component ABSENT.    Health Maintenance  Topic Date Due   Hepatitis C Screening  Never done   Pneumococcal Vaccine (1 of 2 - PCV) Never done   Hepatitis B Vaccine (1 of 3 - 19+ 3-dose series) Never done   HPV Vaccine (1 - 3-dose SCDM series) Never done   Flu Shot   Never done   COVID-19 Vaccine (1 - 2024-25 season) Never done   DTaP/Tdap/Td vaccine (1 - Tdap) 04/28/2024*   Pap with HPV screening  04/24/2028   HIV Screening  Completed   Meningitis B Vaccine  Aged Out  *Topic was postponed. The date shown is not the original due date.      Review of Systems:  Pertinent items are noted in HPI. Comprehensive review of systems was otherwise negative.   Objective:  Physical Exam BP 131/70   Pulse 93   Wt 237 lb 4.8 oz (107.6 kg)   LMP 01/19/2024 (Within Days)   BMI 40.73 kg/m    Physical Exam Vitals and nursing note reviewed.  Constitutional:      Appearance: Normal appearance.  HENT:     Head: Normocephalic and atraumatic.  Pulmonary:     Effort: Pulmonary effort is normal.  Skin:    General: Skin is warm and dry.  Neurological:     General: No focal deficit present.     Mental Status: She is alert.  Psychiatric:        Mood and Affect: Mood normal.        Behavior: Behavior normal.        Thought Content: Thought content normal.        Judgment: Judgment normal.      Labs and Imaging MR PELVIS W WO CONTRAST Result Date: 02/04/2024 CLINICAL DATA:  Uterine fibroids EXAM: MRI PELVIS WITHOUT AND WITH CONTRAST TECHNIQUE: Multiplanar multisequence MR imaging of the pelvis was performed both before and after  administration of intravenous contrast. CONTRAST:  10mL GADAVIST  GADOBUTROL  1 MMOL/ML IV SOLN COMPARISON:  CT abdomen pelvis, 02/13/2022 FINDINGS: Urinary Tract:  No abnormality visualized. Bowel:  Unremarkable visualized pelvic bowel loops. Vascular/Lymphatic: No pathologically enlarged lymph nodes. No significant vascular abnormality seen. Reproductive: Extremely bulky fibroid uterus, extending well into the abdomen, overall dimensions at least 21.7 x 19.9 x 9.5 cm (series 7, image 25, series 4, image 8). Endometrial cavity is distorted, although not completely effaced by multiple submucosal fibroids. Largest fibroids are exophytic  fibroid arising from the fundus, measuring 10.3 x 10.0 x 9.8 cm (series 7, image 12, series 4, image 8). Nonenhancing, degenerative intramural fibroid of the right aspect of the uterine body, remaining fibroids are enhancing. Normal ovaries with small functional follicles. Other:  None. Musculoskeletal: No suspicious bone lesions identified. IMPRESSION: 1. Extremely bulky fibroid uterus, extending well into the abdomen, overall dimensions at least 21.7 x 19.9 x 9.5 cm. 2. Endometrial cavity is distorted, although not completely effaced by multiple submucosal fibroids. 3. Largest fibroids are exophytic fibroids arising from the fundus, measuring up to 10.3 x 10.0 x 9.8 cm. 4. Nonenhancing, degenerative intramural fibroid of the right aspect of the uterine body, remaining fibroids are enhancing. Electronically Signed   By: Marolyn JONETTA Jaksch M.D.   On: 02/04/2024 07:13    Endometrial Biopsy Procedure  Patient identified, informed consent performed,  indication reviewed, consent signed.  Reviewed risk of perforation, pain, bleeding, insufficient sample, etc were reviewd. Time out was performed.  Urine pregnancy test negative.  Speculum placed in the vagina.  Cervix visualized.  Cleaned with Betadine x 2.  Anterior cervix grasped anteriorly with a single tooth tenaculum.  Paracervical block was not administered.  Endometrial pipelle was used to draw up 1cc of 1% lidocaine, introduced into the cervical os and instilled into the endometrial cavity. A ring forcep was used to advance the pipelle into the cavity. The pipelle was passed thrice without difficulty and sample obtained. Tenaculum was removed, good hemostasis noted.  Patient tolerated procedure well.  Patient was given post-procedure instructions.      Assessment & Plan:   Assessment & Plan Excessive and frequent menstruation due to uterine leiomyoma She experiences excessive and frequent menstruation due to uterine leiomyoma. Lupron  discussed as  treatment to reduce bleeding and shrink fibroids, with risks including hot flashes, abdominal discomfort, bleeding changes, and bone health impacts. She is interested in Lupron , Medicaid coverage uncertain. - Now s/p endometrial biopsy (EMB) to obtain adequate tissue sample. - Administer Lupron  injection to manage symptoms and prepare for potential surgery.    Carter Quarry, MD [facility], [facility]

## 2024-02-16 ENCOUNTER — Ambulatory Visit: Payer: Self-pay | Admitting: Obstetrics and Gynecology

## 2024-02-16 LAB — SURGICAL PATHOLOGY

## 2024-02-28 ENCOUNTER — Ambulatory Visit: Admitting: Obstetrics and Gynecology

## 2024-03-09 ENCOUNTER — Ambulatory Visit

## 2024-03-16 ENCOUNTER — Ambulatory Visit (INDEPENDENT_AMBULATORY_CARE_PROVIDER_SITE_OTHER)

## 2024-03-16 ENCOUNTER — Other Ambulatory Visit: Payer: Self-pay

## 2024-03-16 VITALS — BP 117/50 | HR 80 | Wt 231.5 lb

## 2024-03-16 DIAGNOSIS — Z9229 Personal history of other drug therapy: Secondary | ICD-10-CM | POA: Diagnosis not present

## 2024-03-16 DIAGNOSIS — Z3202 Encounter for pregnancy test, result negative: Secondary | ICD-10-CM

## 2024-03-16 LAB — POCT PREGNANCY, URINE: Preg Test, Ur: NEGATIVE

## 2024-03-16 MED ORDER — LEUPROLIDE ACETATE (3 MONTH) 11.25 MG IM KIT
11.2500 mg | PACK | Freq: Once | INTRAMUSCULAR | Status: AC
  Administered 2024-03-16: 11.25 mg via INTRAMUSCULAR

## 2024-03-16 NOTE — Progress Notes (Signed)
 Patient here today for Lupron  injection for fibroids.Injection administered in left deltoid today without complications. Patient will receive the next injection in 3 months 06/01/24-06/15/24. Patient voices no questions or concerns.  Devon Ash 03/16/24

## 2024-04-01 IMAGING — CT CT ABD-PELV W/ CM
2 of 4 series · 16 of 46 positions shown, 18 images · IV contrast (agent unspecified)
Comparison: None Available.

CLINICAL DATA: Right lower quadrant pain, nausea. History of
fibroid uterus

EXAM:
CT ABDOMEN AND PELVIS WITH CONTRAST
TECHNIQUE: Multidetector CT imaging of the abdomen and pelvis was performed
using the standard protocol following bolus administration of
intravenous contrast.

[Series 2: axial st · axial · 0.95mm/px · z∈[+1200,+1574]mm · 13 of 87 slices shown, 15 images]
[im 6/87  soft-tissue]
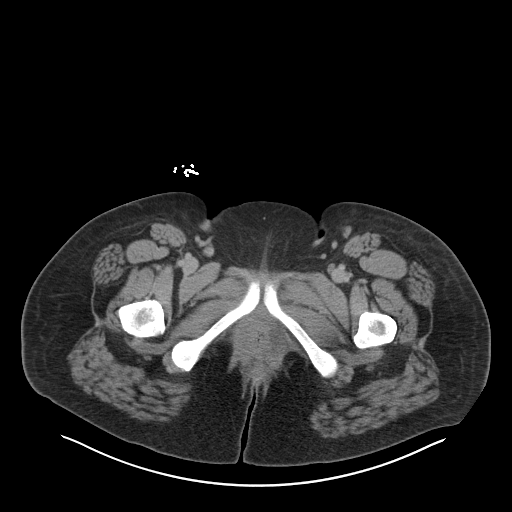
[im 6/87  bone]
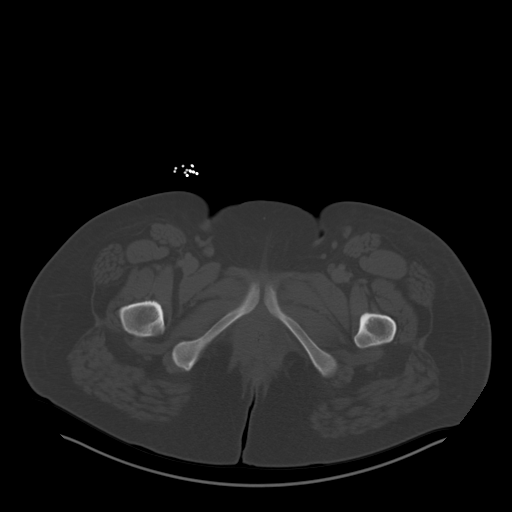
[im 11/87  soft-tissue]
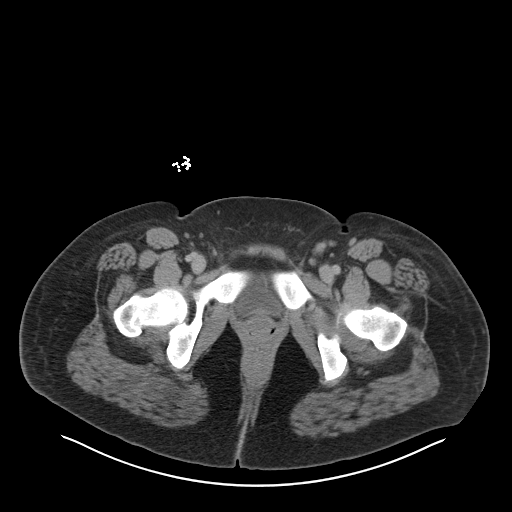
[im 17/87  soft-tissue]
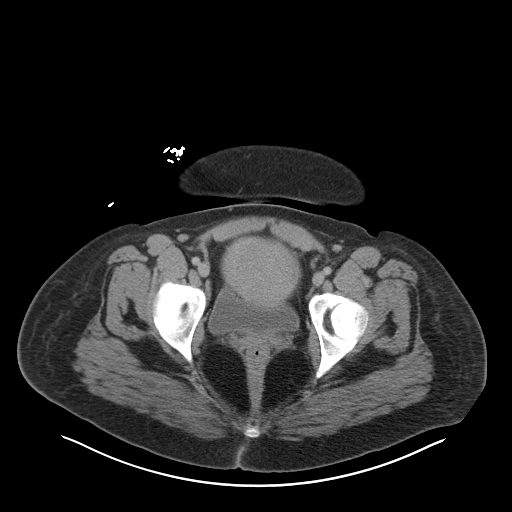
[im 27/87  soft-tissue]
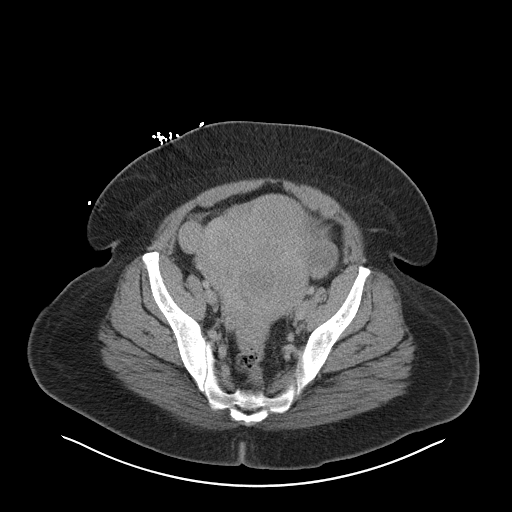
[im 33/87  soft-tissue]
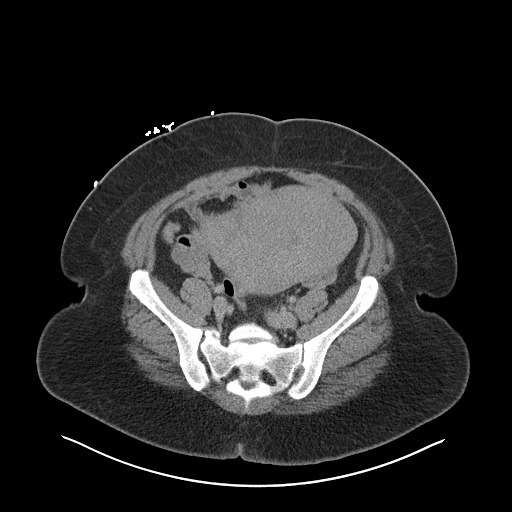
[im 38/87  soft-tissue]
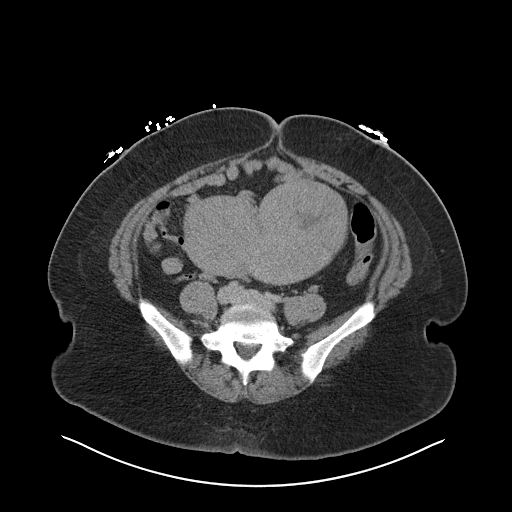
[im 44/87  soft-tissue]
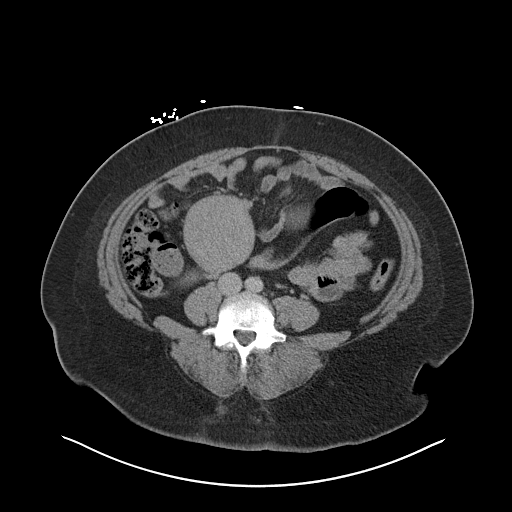
[im 49/87  soft-tissue]
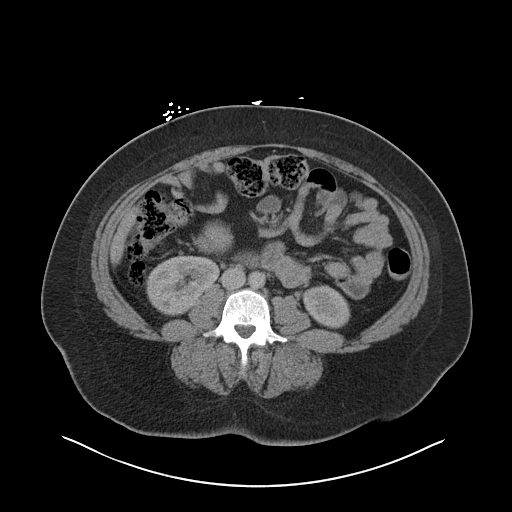
[im 54/87  soft-tissue]
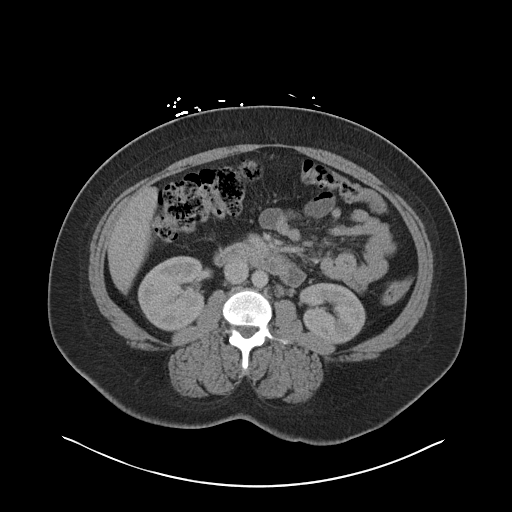
[im 54/87  bone]
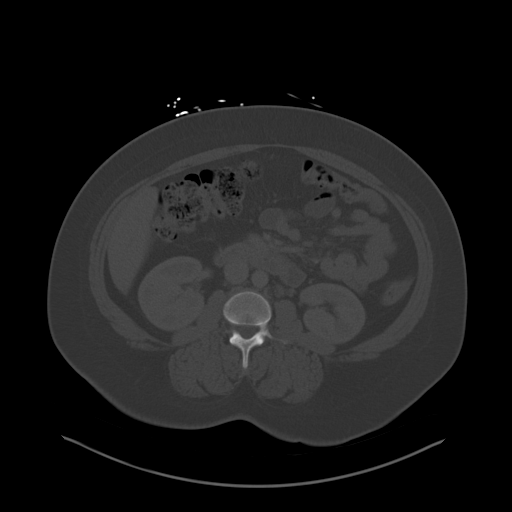
[im 60/87  soft-tissue]
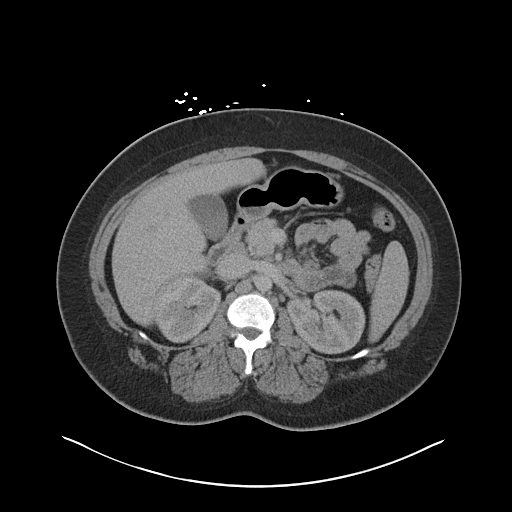
[im 70/87  soft-tissue]
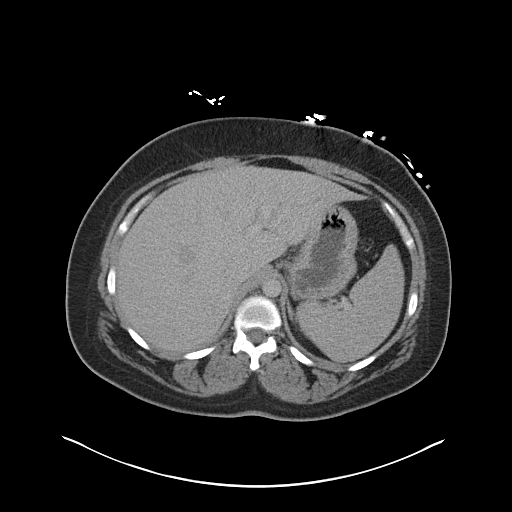
[im 76/87  soft-tissue]
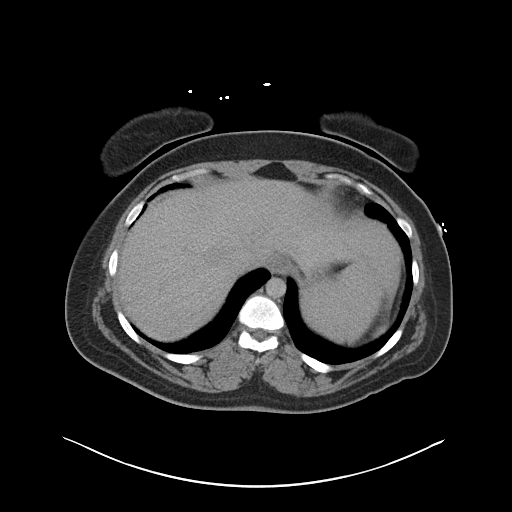
[im 81/87  soft-tissue]
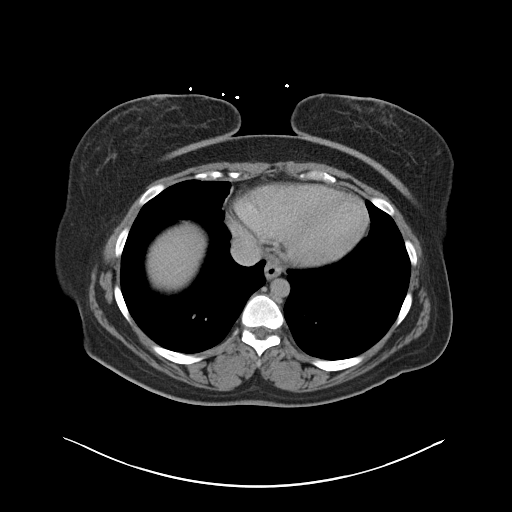

[Series 4: coronal st · coronal · 0.80mm/px · 3 of 160 slices shown]
[im 54/160  soft-tissue]
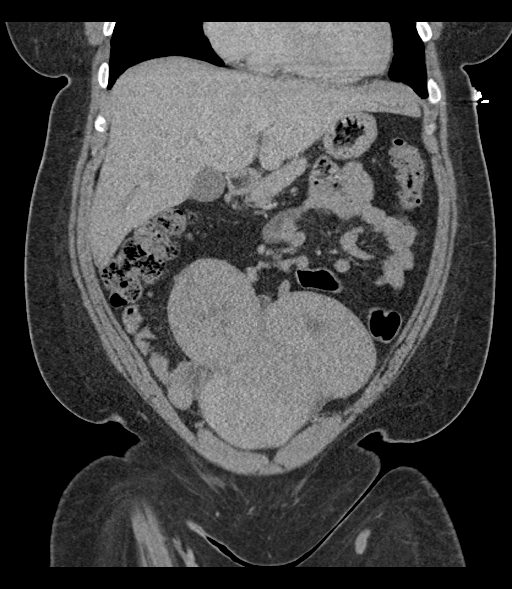
[im 71/160  soft-tissue]
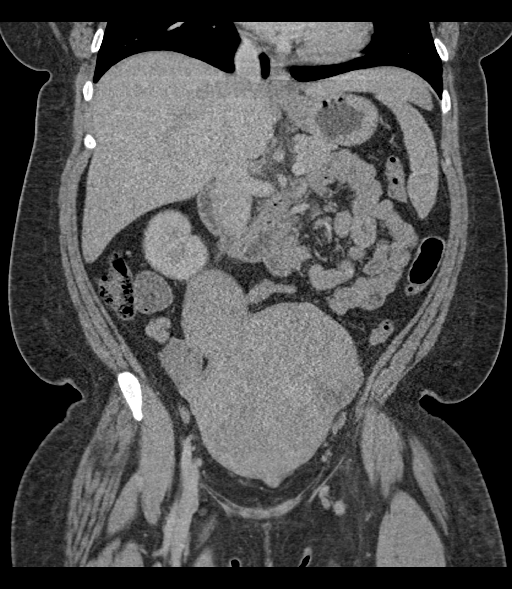
[im 89/160  soft-tissue]
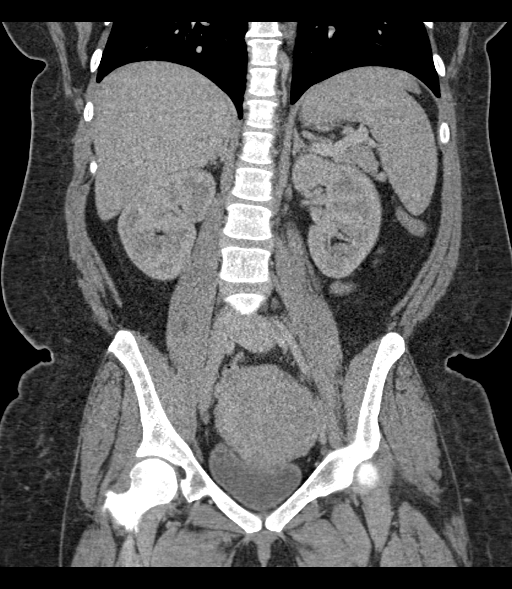

[16 of 46 positions shown; findings below may reference images not displayed]

RADIATION DOSE REDUCTION: This exam was performed according to the
departmental dose-optimization program which includes automated
exposure control, adjustment of the mA and/or kV according to
patient size and/or use of iterative reconstruction technique.

CONTRAST:  100mL OMNIPAQUE IOHEXOL 300 MG/ML  SOLN
FINDINGS: Lower chest: Included lung bases are clear.  Heart size is normal.

Hepatobiliary: No focal liver abnormality is seen. No gallstones,
gallbladder wall thickening, or biliary dilatation.

Pancreas: Unremarkable. No pancreatic ductal dilatation or
surrounding inflammatory changes.

Spleen: Normal in size without focal abnormality.

Adrenals/Urinary Tract: Unremarkable adrenal glands. Kidneys enhance
symmetrically without focal lesion, stone, or hydronephrosis.
Ureters are nondilated. Urinary bladder appears unremarkable.

Stomach/Bowel: Stomach is within normal limits. Appendix appears
normal (series 2, image 50). No evidence of bowel wall thickening,
distention, or inflammatory changes.

Vascular/Lymphatic: No significant vascular findings are present. No
enlarged abdominal or pelvic lymph nodes.

Reproductive: Markedly enlarged multifibroid uterus. The 2 largest
fibroids at the uterine fundus each measure approximately 9 cm in
diameter. Central hypodensity within multiple fibroids suggesting
hyaline degeneration. No adnexal masses identified.

Other: No free fluid. No abdominopelvic fluid collection. No
pneumoperitoneum. No abdominal wall hernia.

Musculoskeletal: No acute or significant osseous findings.
IMPRESSION: 1. No acute abdominopelvic findings. Normal appendix.
2. Markedly enlarged multifibroid uterus.

## 2024-04-30 ENCOUNTER — Ambulatory Visit: Payer: Self-pay | Admitting: Nurse Practitioner

## 2024-05-02 ENCOUNTER — Encounter: Payer: Self-pay | Admitting: Nurse Practitioner

## 2024-05-02 ENCOUNTER — Ambulatory Visit: Payer: Self-pay | Admitting: Nurse Practitioner

## 2024-06-15 ENCOUNTER — Encounter: Payer: Self-pay | Admitting: Obstetrics and Gynecology

## 2024-06-18 ENCOUNTER — Telehealth: Payer: Self-pay

## 2024-06-18 NOTE — Telephone Encounter (Signed)
 Attempted to call patient to schedule nurse visit for Depo Lupron . Left voicemail with office callback number.   [place], RN

## 2024-06-25 ENCOUNTER — Ambulatory Visit: Payer: Self-pay

## 2024-06-25 ENCOUNTER — Other Ambulatory Visit: Payer: Self-pay

## 2024-06-25 VITALS — BP 133/81 | HR 79 | Ht 64.0 in | Wt 234.0 lb

## 2024-06-25 DIAGNOSIS — Z9229 Personal history of other drug therapy: Secondary | ICD-10-CM

## 2024-06-25 LAB — POCT PREGNANCY, URINE: Preg Test, Ur: NEGATIVE

## 2024-06-25 MED ORDER — LEUPROLIDE ACETATE (3 MONTH) 11.25 MG IM KIT
11.2500 mg | PACK | Freq: Once | INTRAMUSCULAR | Status: AC
  Administered 2024-06-25: 11.25 mg via INTRAMUSCULAR

## 2024-06-25 NOTE — Progress Notes (Signed)
 Cheryl Crane here for Lupron  Injection. Last injection administered 03/16/24. UPT result negative. Today Injection administered without complication. Patient will return in 3 months for next injection between 09/10/24 and 09/24/24. Next annual visit due 02/2025. Patient voices no further questions or concerns.  Devon, RN 06/25/2024
